# Patient Record
Sex: Female | Born: 1995 | Race: White | Hispanic: No | Marital: Single | Smoking: Former smoker
Health system: Southern US, Community
[De-identification: ages and names within clinical notes are randomized; demographics above are authoritative.]

## PROBLEM LIST (undated history)

## (undated) ENCOUNTER — Inpatient Hospital Stay: Payer: Self-pay

## (undated) DIAGNOSIS — F329 Major depressive disorder, single episode, unspecified: Secondary | ICD-10-CM

## (undated) DIAGNOSIS — S93402A Sprain of unspecified ligament of left ankle, initial encounter: Secondary | ICD-10-CM

## (undated) DIAGNOSIS — F419 Anxiety disorder, unspecified: Secondary | ICD-10-CM

## (undated) DIAGNOSIS — N920 Excessive and frequent menstruation with regular cycle: Secondary | ICD-10-CM

## (undated) DIAGNOSIS — F32A Depression, unspecified: Secondary | ICD-10-CM

## (undated) DIAGNOSIS — S8255XA Nondisplaced fracture of medial malleolus of left tibia, initial encounter for closed fracture: Secondary | ICD-10-CM

## (undated) DIAGNOSIS — I951 Orthostatic hypotension: Secondary | ICD-10-CM

## (undated) HISTORY — DX: Nondisplaced fracture of medial malleolus of left tibia, initial encounter for closed fracture: S82.55XA

## (undated) HISTORY — DX: Sprain of unspecified ligament of left ankle, initial encounter: S93.402A

## (undated) HISTORY — DX: Orthostatic hypotension: I95.1

## (undated) HISTORY — DX: Excessive and frequent menstruation with regular cycle: N92.0

---
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
## 2015-07-08 LAB — OB RESULTS CONSOLE RUBELLA ANTIBODY, IGM: Rubella: NON-IMMUNE/NOT IMMUNE

## 2015-07-08 LAB — OB RESULTS CONSOLE GC/CHLAMYDIA
CHLAMYDIA, DNA PROBE: POSITIVE
Gonorrhea: NEGATIVE

## 2015-07-08 LAB — OB RESULTS CONSOLE HEPATITIS B SURFACE ANTIGEN: Hepatitis B Surface Ag: NEGATIVE

## 2015-07-08 LAB — OB RESULTS CONSOLE VARICELLA ZOSTER ANTIBODY, IGG: Varicella: NON-IMMUNE/NOT IMMUNE

## 2015-07-08 LAB — OB RESULTS CONSOLE HIV ANTIBODY (ROUTINE TESTING): HIV: NONREACTIVE

## 2015-07-08 LAB — OB RESULTS CONSOLE RPR: RPR: NONREACTIVE

## 2015-08-10 LAB — OB RESULTS CONSOLE GC/CHLAMYDIA
Chlamydia: NEGATIVE
GC PROBE AMP, GENITAL: NEGATIVE

## 2015-11-07 NOTE — L&D Delivery Note (Signed)
Delivery Note At 10:40 AM a vigorous female infant was delivered via Vaginal, Spontaneous Delivery (Presentation: Right Occiput Anterior).  APGAR: 9, 9; weight 6 lb 7 oz (2920 g).   Placenta status: Intact, Spontaneous.  Cord: 3 vessels    Anesthesia: Local/ topical Other  Episiotomy:   Lacerations: Labial;1st degree, bilateral with transverse  laceration on right Suture Repair: 3.0 chromic Est. Blood Loss (mL): 450  Mom to postpartum.  Baby to Couplet care / Skin to Skin.  Jerene Yeager 02/02/2016, 11:39 AM

## 2016-01-11 ENCOUNTER — Encounter: Payer: Self-pay | Admitting: *Deleted

## 2016-01-11 ENCOUNTER — Observation Stay
Admission: EM | Admit: 2016-01-11 | Discharge: 2016-01-11 | Disposition: A | Discharge status: Home / Self Care | Payer: Managed Care, Other (non HMO) | Attending: Obstetrics and Gynecology | Admitting: Obstetrics and Gynecology

## 2016-01-11 DIAGNOSIS — J069 Acute upper respiratory infection, unspecified: Secondary | ICD-10-CM | POA: Insufficient documentation

## 2016-01-11 DIAGNOSIS — O99343 Other mental disorders complicating pregnancy, third trimester: Secondary | ICD-10-CM | POA: Insufficient documentation

## 2016-01-11 DIAGNOSIS — F17210 Nicotine dependence, cigarettes, uncomplicated: Secondary | ICD-10-CM | POA: Insufficient documentation

## 2016-01-11 DIAGNOSIS — O99333 Smoking (tobacco) complicating pregnancy, third trimester: Secondary | ICD-10-CM | POA: Diagnosis not present

## 2016-01-11 DIAGNOSIS — F329 Major depressive disorder, single episode, unspecified: Secondary | ICD-10-CM | POA: Insufficient documentation

## 2016-01-11 DIAGNOSIS — Z3A35 35 weeks gestation of pregnancy: Secondary | ICD-10-CM | POA: Diagnosis not present

## 2016-01-11 DIAGNOSIS — F419 Anxiety disorder, unspecified: Secondary | ICD-10-CM | POA: Insufficient documentation

## 2016-01-11 DIAGNOSIS — O26893 Other specified pregnancy related conditions, third trimester: Secondary | ICD-10-CM | POA: Diagnosis not present

## 2016-01-11 LAB — URINALYSIS COMPLETE WITH MICROSCOPIC (ARMC ONLY)
BILIRUBIN URINE: NEGATIVE
Glucose, UA: NEGATIVE mg/dL
Ketones, ur: NEGATIVE mg/dL
Leukocytes, UA: NEGATIVE
Nitrite: NEGATIVE
PH: 6 (ref 5.0–8.0)
PROTEIN: NEGATIVE mg/dL
Specific Gravity, Urine: 1.01 (ref 1.005–1.030)

## 2016-01-11 MED ORDER — ACETAMINOPHEN 500 MG PO TABS
1000.0000 mg | ORAL_TABLET | Freq: Once | ORAL | Status: AC
  Administered 2016-01-11: 1000 mg via ORAL
  Filled 2016-01-11: qty 2

## 2016-01-11 MED ORDER — OSELTAMIVIR PHOSPHATE 75 MG PO CAPS: 75.0000 mg | ORAL_CAPSULE | Freq: Two times a day (BID) | ORAL | Status: DC

## 2016-01-11 MED ORDER — ACETAMINOPHEN 500 MG PO TABS: 1000.0000 mg | ORAL_TABLET | Freq: Four times a day (QID) | ORAL | Status: DC | PRN

## 2016-01-11 MED ORDER — OSELTAMIVIR PHOSPHATE 75 MG PO CAPS
75.0000 mg | ORAL_CAPSULE | Freq: Two times a day (BID) | ORAL | Status: DC
  Administered 2016-01-11: 75 mg via ORAL
  Filled 2016-01-11: qty 1

## 2016-01-11 NOTE — Progress Notes (Signed)
In room to assume care, pt lying awake in bed, wearing mask, reports she continues to have cough and on and off nausea, lower abd in Rt and Lt groin area and constant lower back pain, rates 4/10,  Confirms active fetal mvmt, no bloody show or leaking fluid. Denies headache, fever, chills or vomiting. VSS, afebrile. Discussed plan for shift and will discharge following med admin. Pt to f/u in OB clinic on 01/21/16, offered crackers and gingerale for po challenge prior to d/c.

## 2016-01-11 NOTE — Discharge Instructions (Signed)
Influenza, Adult Influenza (flu) is an infection in the mouth, nose, and throat (respiratory tract) caused by a virus. The flu can make you feel very ill. Influenza spreads easily from person to person (contagious).  HOME CARE   Only take medicines as told by your doctor.  Use a cool mist humidifier to make breathing easier.  Get plenty of rest until your fever goes away. This usually takes 3 to 4 days.  Drink enough fluids to keep your pee (urine) clear or pale yellow.  Cover your mouth and nose when you cough or sneeze.  Wash your hands well to avoid spreading the flu.  Stay home from work or school for 1 full day, until 01/13/2016 GET HELP RIGHT AWAY IF:   You have trouble breathing or feel short of breath.  Your skin or nails turn blue.  You have severe neck pain or stiffness.  You have a severe headache, facial pain, or earache.  Your fever gets worse or keeps coming back.  You feel sick to your stomach (nauseous), throw up (vomit), or have watery poop (diarrhea).  You have chest pain.  You have a deep cough that gets worse, or you cough up more thick spit (mucus). MAKE SURE YOU:   Understand these instructions.  Will watch your condition.  Will get help right away if you are not doing well or get worse.   This information is not intended to replace advice given to you by your health care provider. Make sure you discuss any questions you have with your health care provider.   Document Released: 08/01/2008 Document Revised: 11/13/2014 Document Reviewed: 01/22/2012 Elsevier Interactive Patient Education [employer]2016 Elsevier Inc.  If you feel that you are feeling worse, come to urgent care or the emergency room.      We will discuss your surgery once again in detail at your post-op visit in two to four weeks. If you havent already done so, please call to make your appointment as soon as possible.  [facility] [place]  [address]    [place], [place] [address] [place], [place] [address]  Phone: [phone] Phone: [phone]  Fax: [phone] Fax: [phone]

## 2016-01-11 NOTE — Final Progress Note (Addendum)
Obstetrics Admission History & Physical  01/11/2016 - 6:31 PM Primary OBGYN: [facility]  Chief Complaint: fetal tachycardia in the office  History of Present Illness  20 y.o. G1 @ 11014w1d (Dating: EDC 4/10, LMP=9wk u/s), with the above CC. Pregnancy complicated by: 1ppd smoker, anxiety/depression, h/o CT with negative TOC.  Ms. Mandy FredricksonKourtney B Reeves states that she had a baby shower on Sunday and then was told yesterday that one of the kids there was +flu. She went to the office today with URI s/s (cough productive of yellowish phlegm, some chest tightness with cough) yesterday and went to the office today for evaluation. She was afebrile @ 99.9 but was noted to have fetal tachycardia on dopplers so was sent to L&D for evaluation.  Pt denies any n/v, SOB, fevers. She did not get the flu shot this year and declined the tdap. Rapid flu in the office negative.   Review of Systems: her 12 point review of systems is negative or as noted in the History of Present Illness. PMHx:  Past Medical History  Diagnosis Date  . Depression    PSHx: No past surgical history on file. Medications: none  Allergies: has No Known Allergies. OBHx: as per HPI GYNHx:  History of STIs: Yes.  .             FHx: No family history on file. Soc Hx:  Social History   Social History  . Marital Status: Single    Spouse Name: N/A  . Number of Children: N/A  . Years of Education: N/A   Occupational History  . Not on file.   Social History Main Topics  . Smoking status: Current Every Day Smoker -- 1.00 packs/day    Types: Cigarettes  . Smokeless tobacco: Not on file  . Alcohol Use: 0.0 oz/week     Comment: 3-4 mixed drinks per week  . Drug Use: No  . Sexual Activity: Yes    Birth Control/ Protection: Injection     Comment: Depo shot   Other Topics Concern  . Not on file   Social History Narrative    Objective    Current Vital Signs 24h Vital Sign Ranges  T 98 F (36.7 C) Temp  Avg: 98 F (36.7 C)  Min: 98  F (36.7 C)  Max: 98 F (36.7 C)  BP 130/77 mmHg BP  Min: 130/77  Max: 130/77  HR (!) 116 Pulse  Avg: 116  Min: 116  Max: 116  RR  16   SaO2    98/RA No Data Recorded       24  Hour I/O Current Shift I/O  Time Ins Outs       EFM: 145 baseline, +accels, no decels, mod var  Toco: +irritability  General: Well nourished, well developed female in no acute distress.  Skin:  Warm and dry.  Cardiovascular: nl s1 and s2, no MRGs. HR in the 100s Respiratory:  Clear to auscultation bilateral. Normal respiratory effort Abdomen: gravid, nttp Neuro/Psych:  Normal mood and affect.    Labs  none  Radiology none  Assessment & Plan   20 y.o. G1 @ 2714w1d with likely viral URI. Pt stable *IUP: fetal status reassuring with rNST *ID: recommended that she start bid tamiflu x 5d, given inaccuracies with rapid flu, her exposure and s/s. ER/UC precautions given. Pt told to do a lot of PO hydration as susceptible to it with illness and told about safe OTCs *Dispo: d/c to home  Mandy Reeves, Montez HagemanJr.  MD Consuella Lose Pager [phone]

## 2016-01-11 NOTE — Plan of Care (Signed)
G1 P0 EDC 02/14/2016 [facility] with complaint of elevated FHR in office up to 190's for several minutes. [facility].  Pt in Obs 1 for assessment.  Pt placed on EFM and discussed NST and initial plan of care. Pt is very anxious on arrival and teary.  Encouraging relaxation.  Will discuss with MD. Ellison Carwin Cherlynn Popiel RNC

## 2016-01-11 NOTE — Progress Notes (Signed)
Report received from Ellison Carwin. Gaither, RN. Pt to provide urine sample for UA and Tamiflu to be given. Influenza test neg. Pt may be discharged home.

## 2016-01-11 NOTE — OB Triage Note (Signed)
Discharge instructions provided to pt, verbal and written including preterm labor precautions and kick counts

## 2016-01-11 NOTE — OB Triage Note (Signed)
Pt states that she had a baby shower yesterday and found out that there was a child there that had a positive flu test.  [facility] and had a negative test herself.  She has a cough since yesterday that is occas productive with a greenish mucous that comes up and sometimes only a dry cough. Denies temperature or flu like sx.   Ellison Carwin Kalani Baray RNC

## 2016-01-11 NOTE — Progress Notes (Signed)
Pt tolerating po well, still occasional nauseated but no vomiting, explained round ligament pain and interventions to ease discomfort.Rx for Tamiflu to continue treatment given to pt. Discharge teaching and paperwork provided to pt and family present in room. Preterm labor precautions given to pt. Encouraged drinking plenty fluids, rest, warm shower, stretching, take medication as prescribed and f/u as scheduled with OB provider. Call or return to hospital sooner with any questions or concerns.

## 2016-01-11 NOTE — Progress Notes (Signed)
Pt denies any questions needs or concerns, ambulatory out of OBS with family present at side for discharge home in no apparent distress.

## 2016-01-21 LAB — OB RESULTS CONSOLE GBS: GBS: NEGATIVE

## 2016-01-28 ENCOUNTER — Observation Stay
Admission: EM | Admit: 2016-01-28 | Discharge: 2016-01-28 | Disposition: A | Discharge status: Home / Self Care | Payer: Managed Care, Other (non HMO) | Attending: Obstetrics & Gynecology | Admitting: Obstetrics & Gynecology

## 2016-01-28 DIAGNOSIS — Z3A37 37 weeks gestation of pregnancy: Secondary | ICD-10-CM | POA: Diagnosis not present

## 2016-01-28 DIAGNOSIS — R030 Elevated blood-pressure reading, without diagnosis of hypertension: Secondary | ICD-10-CM | POA: Insufficient documentation

## 2016-01-28 DIAGNOSIS — O9989 Other specified diseases and conditions complicating pregnancy, childbirth and the puerperium: Secondary | ICD-10-CM | POA: Diagnosis not present

## 2016-01-28 LAB — COMPREHENSIVE METABOLIC PANEL
ALBUMIN: 2.8 g/dL — AB (ref 3.5–5.0)
ALT: 13 U/L — ABNORMAL LOW (ref 14–54)
ANION GAP: 7 (ref 5–15)
AST: 18 U/L (ref 15–41)
Alkaline Phosphatase: 135 U/L — ABNORMAL HIGH (ref 38–126)
BILIRUBIN TOTAL: 0.6 mg/dL (ref 0.3–1.2)
BUN: 7 mg/dL (ref 6–20)
CHLORIDE: 107 mmol/L (ref 101–111)
CO2: 20 mmol/L — ABNORMAL LOW (ref 22–32)
Calcium: 8.6 mg/dL — ABNORMAL LOW (ref 8.9–10.3)
Creatinine, Ser: 0.44 mg/dL (ref 0.44–1.00)
GFR calc Af Amer: >60 mL/min (ref >60)
GLUCOSE: 105 mg/dL — AB (ref 65–99)
POTASSIUM: 3.4 mmol/L — AB (ref 3.5–5.1)
Sodium: 134 mmol/L — ABNORMAL LOW (ref 135–145)
TOTAL PROTEIN: 5.7 g/dL — AB (ref 6.5–8.1)

## 2016-01-28 LAB — CBC
HEMATOCRIT: 32.8 % — AB (ref 35.0–47.0)
Hemoglobin: 11.3 g/dL — ABNORMAL LOW (ref 12.0–16.0)
MCH: 31.1 pg (ref 26.0–34.0)
MCHC: 34.6 g/dL (ref 32.0–36.0)
MCV: 89.8 fL (ref 80.0–100.0)
PLATELETS: 205 10*3/uL (ref 150–440)
RBC: 3.65 MIL/uL — ABNORMAL LOW (ref 3.80–5.20)
RDW: 13.1 % (ref 11.5–14.5)
WBC: 8.5 10*3/uL (ref 3.6–11.0)

## 2016-01-28 LAB — PROTEIN / CREATININE RATIO, URINE
CREATININE, URINE: 23 mg/dL
Total Protein, Urine: <6 mg/dL

## 2016-01-28 NOTE — Discharge Instructions (Signed)
Pt given discharge instructions, both oral and written.  Pt verbalized understanding of plan of care. OB labor precautions discussed at length and signs of elevated blood pressure.  She and her mother are agreeable to plan. Pt will keep next scheduled OB appt with [facility].  Mandy Reeves Mandy Reeves RNC

## 2016-01-28 NOTE — Plan of Care (Signed)
CNM reviewed lab results. Pt to be discharged home with labor precautions.  Ellison Carwin Linzy Laury RNC

## 2016-01-28 NOTE — OB Triage Note (Signed)
Patient came in from office with elevated blood pressures. Patient denies complaints of pain, vaginal bleeding, or blurred vision. FHR baseline 140 with moderate variability with 15 x 15 accelerations and no decelerations. Family at bedside. Will continue to monitor.

## 2016-01-28 NOTE — Final Progress Note (Signed)
Physician Final Progress Note  Patient ID: Mandy Reeves MRN: [id number] DOB/AGE: 02/27/1996 20 y.o.  Admit date: 01/28/2016 Admitting provider: Gae Dry, MD Discharge date: 01/28/2016   Admission Diagnoses: IUP at 37 4/7 with elevated blood pressure in clinic  Discharge Diagnoses:  Active Problems:   Indication for care in labor and delivery, antepartum  IUP at 37 4/7 normo tensive, normal labs, denies HA/visual changes/epigastric pain  Consults: None  Significant Findings/ Diagnostic Studies: labs:  Results for Mandy, Reeves (MRN [id number]) as of 01/28/2016 14:53  Ref. Range 01/28/2016 12:27  Sodium Latest Ref Range: 135-145 mmol/L 134 (L)  Potassium Latest Ref Range: 3.5-5.1 mmol/L 3.4 (L)  Chloride Latest Ref Range: 101-111 mmol/L 107  CO2 Latest Ref Range: 22-32 mmol/L 20 (L)  BUN Latest Ref Range: 6-20 mg/dL 7  Creatinine Latest Ref Range: 0.44-1.00 mg/dL 0.44  Calcium Latest Ref Range: 8.9-10.3 mg/dL 8.6 (L)  EGFR (Non-African Amer.) Latest Ref Range: >60 mL/min >60  EGFR (African American) Latest Ref Range: >60 mL/min >60  Glucose Latest Ref Range: 65-99 mg/dL 105 (H)  Anion gap Latest Ref Range: 5-15  7  Alkaline Phosphatase Latest Ref Range: 38-126 U/L 135 (H)  Albumin Latest Ref Range: 3.5-5.0 g/dL 2.8 (L)  AST Latest Ref Range: 15-41 U/L 18  ALT Latest Ref Range: 14-54 U/L 13 (L)  Total Protein Latest Ref Range: 6.5-8.1 g/dL 5.7 (L)  Total Bilirubin Latest Ref Range: 0.3-1.2 mg/dL 0.6  WBC Latest Ref Range: 3.6-11.0 K/uL 8.5  RBC Latest Ref Range: 3.80-5.20 MIL/uL 3.65 (L)  Hemoglobin Latest Ref Range: 12.0-16.0 g/dL 11.3 (L)  HCT Latest Ref Range: 35.0-47.0 % 32.8 (L)  MCV Latest Ref Range: 80.0-100.0 fL 89.8  MCH Latest Ref Range: 26.0-34.0 pg 31.1  MCHC Latest Ref Range: 32.0-36.0 g/dL 34.6  RDW Latest Ref Range: 11.5-14.5 % 13.1  Platelets Latest Ref Range: 150-440 K/uL 205  Total Protein, Urine Latest Units: mg/dL <6  Protein Creatinine  Ratio Latest Ref Range: 0.00-0.15 mg/mgCre Pend  Creatinine, Urine Latest Units: mg/dL 23  Protein Creatinine Ratio unable to calculate   Procedures: NST reactive baseline 135 with moderate variability, + accelerations, - decelerations TOCO: occasional Fetal Well Being: Cat I  Discharge Condition: good  Disposition: 01-Home or Self Care  Diet: Regular diet  Discharge Activity: Activity as tolerated      Discharge Instructions    Discharge activity:  No Restrictions    Complete by:  As directed      Discharge diet:  No restrictions    Complete by:  As directed      Fetal Kick Count:  Lie on our left side for one hour after a meal, and count the number of times your baby kicks.  If it is less than 5 times, get up, move around and drink some juice.  Repeat the test 30 minutes later.  If it is still less than 5 kicks in an hour, notify your doctor.    Complete by:  As directed      LABOR:  When conractions begin, you should start to time them from the beginning of one contraction to the beginning  of the next.  When contractions are 5 - 10 minutes apart or less and have been regular for at least an hour, you should call your health care provider.    Complete by:  As directed      No sexual activity restrictions    Complete by:  As directed  Notify physician for bleeding from the vagina    Complete by:  As directed      Notify physician for blurring of vision or spots before the eyes    Complete by:  As directed      Notify physician for chills or fever    Complete by:  As directed      Notify physician for fainting spells, "black outs" or loss of consciousness    Complete by:  As directed      Notify physician for increase in vaginal discharge    Complete by:  As directed      Notify physician for leaking of fluid    Complete by:  As directed      Notify physician for pain or burning when urinating    Complete by:  As directed      Notify physician for pelvic pressure  (sudden increase)    Complete by:  As directed      Notify physician for severe or continued nausea or vomiting    Complete by:  As directed      Notify physician for sudden gushing of fluid from the vagina (with or without continued leaking)    Complete by:  As directed      Notify physician for sudden, constant, or occasional abdominal pain    Complete by:  As directed      Notify physician if baby moving less than usual    Complete by:  As directed             Medication List    TAKE these medications        acetaminophen 500 MG tablet  Commonly known as:  TYLENOL  Take 2 tablets (1,000 mg total) by mouth every 6 (six) hours as needed for mild pain, moderate pain, fever or headache.     oseltamivir 75 MG capsule  Commonly known as:  TAMIFLU  Take 1 capsule (75 mg total) by mouth 2 (two) times daily.     polyethylene glycol packet  Commonly known as:  MIRALAX / GLYCOLAX  Take 17 g by mouth daily. Reported on 01/28/2016       Follow-up Information    Please follow up.   Why:  keep regular scheduled appt with [facility]      Total time spent taking care of this patient: 30 minutes  Signed: Rod Can, CNM  This patient and plan were discussed with Dr Kenton Kingfisher 01/28/2016

## 2016-01-30 ENCOUNTER — Observation Stay
Admission: EM | Admit: 2016-01-30 | Discharge: 2016-01-30 | Disposition: A | Discharge status: Home / Self Care | Payer: Managed Care, Other (non HMO) | Attending: Obstetrics and Gynecology | Admitting: Obstetrics and Gynecology

## 2016-01-30 DIAGNOSIS — Z3A38 38 weeks gestation of pregnancy: Secondary | ICD-10-CM | POA: Diagnosis not present

## 2016-01-30 DIAGNOSIS — O9989 Other specified diseases and conditions complicating pregnancy, childbirth and the puerperium: Principal | ICD-10-CM | POA: Insufficient documentation

## 2016-01-30 DIAGNOSIS — R030 Elevated blood-pressure reading, without diagnosis of hypertension: Secondary | ICD-10-CM | POA: Diagnosis not present

## 2016-01-30 HISTORY — DX: Anxiety disorder, unspecified: F41.9

## 2016-01-30 NOTE — Final Progress Note (Signed)
Patient presented for evaluation of concern for elevated blood pressure. I reviewed her vital signs and fetal tracing, both of which were reassuring.  She has had blood pressures at home with SBP 150.  Here all very much in normal range.  Patient was discharge with preeclampsia precautions.

## 2016-02-02 ENCOUNTER — Encounter: Payer: Self-pay | Admitting: *Deleted

## 2016-02-02 ENCOUNTER — Inpatient Hospital Stay
Admission: EM | Admit: 2016-02-02 | Discharge: 2016-02-04 | DRG: 775 | Disposition: A | Discharge status: Home / Self Care | Payer: Managed Care, Other (non HMO) | Attending: Obstetrics & Gynecology | Admitting: Obstetrics & Gynecology

## 2016-02-02 DIAGNOSIS — O99334 Smoking (tobacco) complicating childbirth: Secondary | ICD-10-CM | POA: Diagnosis present

## 2016-02-02 DIAGNOSIS — F17210 Nicotine dependence, cigarettes, uncomplicated: Secondary | ICD-10-CM | POA: Diagnosis present

## 2016-02-02 DIAGNOSIS — F419 Anxiety disorder, unspecified: Secondary | ICD-10-CM | POA: Diagnosis present

## 2016-02-02 DIAGNOSIS — Z23 Encounter for immunization: Secondary | ICD-10-CM | POA: Diagnosis not present

## 2016-02-02 DIAGNOSIS — O99344 Other mental disorders complicating childbirth: Principal | ICD-10-CM | POA: Diagnosis present

## 2016-02-02 DIAGNOSIS — O99343 Other mental disorders complicating pregnancy, third trimester: Secondary | ICD-10-CM | POA: Diagnosis present

## 2016-02-02 DIAGNOSIS — Z3A38 38 weeks gestation of pregnancy: Secondary | ICD-10-CM

## 2016-02-02 DIAGNOSIS — F329 Major depressive disorder, single episode, unspecified: Secondary | ICD-10-CM | POA: Diagnosis present

## 2016-02-02 LAB — COMPREHENSIVE METABOLIC PANEL
ALT: 10 U/L — AB (ref 14–54)
AST: 18 U/L (ref 15–41)
Albumin: 3.1 g/dL — ABNORMAL LOW (ref 3.5–5.0)
Alkaline Phosphatase: 162 U/L — ABNORMAL HIGH (ref 38–126)
Anion gap: 9 (ref 5–15)
BILIRUBIN TOTAL: 0.4 mg/dL (ref 0.3–1.2)
BUN: 8 mg/dL (ref 6–20)
CO2: 20 mmol/L — ABNORMAL LOW (ref 22–32)
CREATININE: 0.45 mg/dL (ref 0.44–1.00)
Calcium: 9 mg/dL (ref 8.9–10.3)
Chloride: 104 mmol/L (ref 101–111)
GFR calc Af Amer: >60 mL/min (ref >60)
Glucose, Bld: 101 mg/dL — ABNORMAL HIGH (ref 65–99)
Potassium: 3.6 mmol/L (ref 3.5–5.1)
Sodium: 133 mmol/L — ABNORMAL LOW (ref 135–145)
TOTAL PROTEIN: 6.4 g/dL — AB (ref 6.5–8.1)

## 2016-02-02 LAB — CHLAMYDIA/NGC RT PCR (ARMC ONLY)
Chlamydia Tr: NOT DETECTED
N GONORRHOEAE: NOT DETECTED

## 2016-02-02 LAB — RAPID HIV SCREEN (HIV 1/2 AB+AG)
HIV 1/2 Antibodies: NONREACTIVE
HIV-1 P24 Antigen - HIV24: NONREACTIVE

## 2016-02-02 LAB — CBC
HEMATOCRIT: 34.3 % — AB (ref 35.0–47.0)
HEMOGLOBIN: 12.1 g/dL (ref 12.0–16.0)
MCH: 32.1 pg (ref 26.0–34.0)
MCHC: 35.4 g/dL (ref 32.0–36.0)
MCV: 90.7 fL (ref 80.0–100.0)
Platelets: 220 10*3/uL (ref 150–440)
RBC: 3.78 MIL/uL — ABNORMAL LOW (ref 3.80–5.20)
RDW: 13.3 % (ref 11.5–14.5)
WBC: 10.7 10*3/uL (ref 3.6–11.0)

## 2016-02-02 LAB — TYPE AND SCREEN
ABO/RH(D): O POS
ANTIBODY SCREEN: NEGATIVE

## 2016-02-02 LAB — ABO/RH: ABO/RH(D): O POS

## 2016-02-02 MED ORDER — LACTATED RINGERS IV SOLN
INTRAVENOUS | Status: DC
  Administered 2016-02-02: 07:00:00 via INTRAVENOUS

## 2016-02-02 MED ORDER — ONDANSETRON HCL 4 MG/2ML IJ SOLN
4.0000 mg | INTRAMUSCULAR | Status: AC
  Administered 2016-02-02: 4 mg via INTRAVENOUS
  Filled 2016-02-02: qty 2

## 2016-02-02 MED ORDER — PRENATAL MULTIVITAMIN CH
1.0000 | ORAL_TABLET | Freq: Every day | ORAL | Status: DC
  Administered 2016-02-03 – 2016-02-04 (×2): 1 via ORAL
  Filled 2016-02-02 (×2): qty 1

## 2016-02-02 MED ORDER — OXYTOCIN BOLUS FROM INFUSION: 500.0000 mL | INTRAVENOUS | Status: DC

## 2016-02-02 MED ORDER — MISOPROSTOL 200 MCG PO TABS
800.0000 ug | ORAL_TABLET | Freq: Once | ORAL | Status: AC
  Administered 2016-02-02: 800 ug via RECTAL

## 2016-02-02 MED ORDER — BENZOCAINE-MENTHOL 20-0.5 % EX AERO
INHALATION_SPRAY | CUTANEOUS | Status: AC
  Filled 2016-02-02: qty 56

## 2016-02-02 MED ORDER — ONDANSETRON HCL 4 MG/2ML IJ SOLN
4.0000 mg | Freq: Four times a day (QID) | INTRAMUSCULAR | Status: DC | PRN
  Administered 2016-02-02: 4 mg via INTRAVENOUS
  Filled 2016-02-02: qty 2

## 2016-02-02 MED ORDER — SENNOSIDES-DOCUSATE SODIUM 8.6-50 MG PO TABS
2.0000 | ORAL_TABLET | ORAL | Status: DC
  Administered 2016-02-02 – 2016-02-04 (×2): 2 via ORAL
  Filled 2016-02-02 (×2): qty 2

## 2016-02-02 MED ORDER — FERROUS SULFATE 325 (65 FE) MG PO TABS
325.0000 mg | ORAL_TABLET | Freq: Every day | ORAL | Status: DC
  Administered 2016-02-03 – 2016-02-04 (×2): 325 mg via ORAL
  Filled 2016-02-02 (×2): qty 1

## 2016-02-02 MED ORDER — SIMETHICONE 80 MG PO CHEW: 80.0000 mg | CHEWABLE_TABLET | ORAL | Status: DC | PRN

## 2016-02-02 MED ORDER — ONDANSETRON HCL 4 MG/2ML IJ SOLN: 4.0000 mg | INTRAMUSCULAR | Status: DC | PRN

## 2016-02-02 MED ORDER — IBUPROFEN 600 MG PO TABS
600.0000 mg | ORAL_TABLET | Freq: Four times a day (QID) | ORAL | Status: DC | PRN
  Administered 2016-02-02 – 2016-02-04 (×7): 600 mg via ORAL
  Filled 2016-02-02 (×7): qty 1

## 2016-02-02 MED ORDER — LANOLIN HYDROUS EX OINT
TOPICAL_OINTMENT | CUTANEOUS | Status: DC | PRN
Start: 2016-02-02 — End: 2016-02-04

## 2016-02-02 MED ORDER — BUTORPHANOL TARTRATE 1 MG/ML IJ SOLN: 1.0000 mg | INTRAMUSCULAR | Status: DC | PRN

## 2016-02-02 MED ORDER — FAMOTIDINE IN NACL 20-0.9 MG/50ML-% IV SOLN
20.0000 mg | Freq: Two times a day (BID) | INTRAVENOUS | Status: DC
  Administered 2016-02-02: 20 mg via INTRAVENOUS
  Filled 2016-02-02: qty 50

## 2016-02-02 MED ORDER — FAMOTIDINE 20 MG PO TABS: 20.0000 mg | ORAL_TABLET | Freq: Two times a day (BID) | ORAL | Status: DC

## 2016-02-02 MED ORDER — WITCH HAZEL-GLYCERIN EX PADS: 1.0000 "application " | MEDICATED_PAD | CUTANEOUS | Status: DC | PRN

## 2016-02-02 MED ORDER — BENZOCAINE-MENTHOL 20-0.5 % EX AERO: 1.0000 "application " | INHALATION_SPRAY | CUTANEOUS | Status: DC | PRN

## 2016-02-02 MED ORDER — BUTORPHANOL TARTRATE 1 MG/ML IJ SOLN
1.0000 mg | INTRAMUSCULAR | Status: DC | PRN
  Administered 2016-02-02: 1 mg via INTRAVENOUS
  Filled 2016-02-02: qty 1

## 2016-02-02 MED ORDER — ONDANSETRON HCL 4 MG PO TABS: 4.0000 mg | ORAL_TABLET | ORAL | Status: DC | PRN

## 2016-02-02 MED ORDER — DOCUSATE SODIUM 100 MG PO CAPS
100.0000 mg | ORAL_CAPSULE | Freq: Every day | ORAL | Status: DC
  Administered 2016-02-03 – 2016-02-04 (×2): 100 mg via ORAL
  Filled 2016-02-02 (×2): qty 1

## 2016-02-02 MED ORDER — ACETAMINOPHEN 325 MG PO TABS: 650.0000 mg | ORAL_TABLET | ORAL | Status: DC | PRN

## 2016-02-02 MED ORDER — OXYTOCIN 40 UNITS IN LACTATED RINGERS INFUSION - SIMPLE MED
2.5000 [IU]/h | INTRAVENOUS | Status: DC
  Administered 2016-02-02: 39.96 [IU]/h via INTRAVENOUS
  Filled 2016-02-02: qty 1000

## 2016-02-02 MED ORDER — DIBUCAINE 1 % RE OINT: 1.0000 "application " | TOPICAL_OINTMENT | RECTAL | Status: DC | PRN

## 2016-02-02 MED ORDER — TETANUS-DIPHTH-ACELL PERTUSSIS 5-2.5-18.5 LF-MCG/0.5 IM SUSP: 0.5000 mL | Freq: Once | INTRAMUSCULAR | Status: DC

## 2016-02-02 MED ORDER — LACTATED RINGERS IV SOLN: 500.0000 mL | INTRAVENOUS | Status: DC | PRN

## 2016-02-02 NOTE — H&P (Signed)
OB History & Physical   History of Present Illness:  Chief Complaint: Complains of contractions since 2330 last night. Began having nausea and vomiting after arrival this AM. Complains of leaking fluid this AM. HPI:  Mandy Reeves is a 20 y.o. G1P0000 female at 6247w2d dated by LMP=9wk ultrasound.  Her pregnancy has been complicated by Chlamydia with a negative TOC, smoking, anxiety/depression, and multiple abdominal and facial piercings. .  She presented to L&D for evaluation of labor. Her contractions on arrival were q3-7 min apart, then spaced a little after Zofran was given, but have now increased to q3 min apart. Started feeling some leakage of water and was found to have had gross SROM of clear fluid with positive Nitrazine. Cervix has progressed from 3cm to 4 cm.  Pren[facility] care site: [facility]. Has had a counselor for her depression/anxiety in the past. Not currently on any medication. Smoking 1/2 PPD. Was exposed to Tamiflu earlier this month and was prescribed Tamiflu. Declined TDAP antenatally.       Maternal Medical History:   Past Medical History  Diagnosis Date  . Depression   . Anxiety     Past Surgical History  Procedure Laterality Date  . Tonsillectomy and adenoidectomy      at age 89    No Known Allergies  Prior to Admission medications   Medication Sig Start Date End Date Taking? Authorizing Provider  Prenatal Vit-Fe Fumarate-FA (PRENATAL MULTIVITAMIN) TABS tablet Take 1 tablet by mouth daily at 12 noon.   Yes Historical Provider, MD  acetaminophen (TYLENOL) 500 MG tablet Take 2 tablets (1,000 mg total) by mouth every 6 (six) hours as needed for mild pain, moderate pain, fever or headache. Patient not taking: Reported on 01/28/2016 01/11/16   [place] Bingharlie Pickens, MD  oseltamivir (TAMIFLU) 75 MG capsule Take 1 capsule (75 mg total) by mouth 2 (two) times daily. Patient not taking: Reported on 01/28/2016 01/11/16   [place] Bingharlie Pickens, MD  polyethylene glycol  (MIRALAX / GLYCOLAX) packet Take 17 g by mouth daily. Reported on 02/02/2016 01/31/13   Jolene SchimkeKim B Winson, NP          Social History: She  reports that she has been smoking Cigarettes.  She has been smoking about 0.50 packs per day. She has never used smokeless tobacco. She reports that she does not drink alcohol or use illicit drugs.  Family History: family history includes Cancer in her maternal grandfather and paternal grandmother; Hypertension in her father; Stroke in her father.   Review of Systems: Negative x 10 systems reviewed except as noted in the HPI.      Physical Exam:  Vital Signs: BP 137/73 mmHg  Pulse 77  Temp(Src) 98.2 F (36.8 C) (Oral)  Resp 20  Ht 5\' 3"  (1.6 m)  Wt 80.74 kg (178 lb)  BMI 31.54 kg/m2  LMP 05/10/2015 General: moaning with contractions, vomiting HEENT: normocephalic, atraumatic, piercings in nose, lip Heart: regular rate & rhythm.  No murmurs Lungs: clear to auscultation bilaterally Abdomen: soft, gravid, tender with contractions;  EFW: 6# 2 dermal piercings on the right lower abdomen. States went to dermatology to have removed, but dermatologist would not remove. Two umbilical piercings which were removed Pelvic:   External: Normal external female genitalia  Cervix: Dilation: 4 / Effacement (%): 100 / Station: 0, -1     ROM: + pooling;+nitrazine;  Extremities: non-tender, symmetric, trace edema bilaterally.  DTRs: +3/+3  Neurologic: Alert & oriented x 3.    Pertinent  Results:  Prenatal Labs: Blood type/Rh O positive  Antibody screen negative  Rubella Varicella Nonimmune nonimmune  RPR negative  HBsAg negative  HIV negative  GC negative  Chlamydia Positive with NOB, TOC negative x2  Genetic screening declines  1 hour GTT 77  3 hour GTT NA  GBS negative on 01/21/2016   Baseline FHR: 130s with accelerations to 160s, moderate variability, some variable decelerations to 100-110s. Toco: contractions q3 min lasting  >60sec   Assessment:  Mandy Reeves is a 20 y.o. G1P0000 female at [redacted]w[redacted]d in labor with SROM (around 1610-9604)   Plan:  1. Admit to Labor & Delivery-expectant, supportive management 2. CBC, T&S already drawn, add CMP. IVF. Zofran for nausea vomiting and Pepcid for reflux 3. GBS negative.   4. Consents obtained. 5. FWB- overall reassuring, monitor for worsening decelerations 6. Stadol for pain, epidural if desires  Farrel Conners  02/02/2016 8:46 AM

## 2016-02-02 NOTE — Discharge Summary (Signed)
Physician Obstetric Discharge Summary  Patient ID: Mandy FredricksonKourtney B Wamboldt MRN: [id number] DOB/AGE: January 04, 1996 20 y.o.   Date of Admission: 02/02/2016  Date of Discharge: 02/04/2016  Admitting Diagnosis: Onset of Labor at 5145w2d  Secondary Diagnosis: depression  Mode of Delivery: normal spontaneous vaginal delivery 02/02/2016       Discharge Diagnosis: Term intrauterine pregnancy delivered at 38 weeks   Intrapartum Procedures: repair of labial lacerations   Post partum procedures: none  Complications: labial laceration   Brief Hospital Course  Mandy FredricksonKourtney B Kubitz is a G1P1001 who had a SVD on 02/02/2016;  for further details of this delivery, please refer to the delivery note.  Patient had an uncomplicated postpartum course.  By time of discharge on PPD#2, her pain was controlled on oral pain medications; she had appropriate lochia and was ambulating, voiding without difficulty and tolerating regular diet.  She was deemed stable for discharge to home.    Labs: CBC Latest Ref Rng 02/03/2016 02/02/2016 01/28/2016  WBC 3.6 - 11.0 K/uL 12.2(H) 10.7 8.5  Hemoglobin 12.0 - 16.0 g/dL 10.2(L) 12.1 11.3(L)  Hematocrit 35.0 - 47.0 % 30.0(L) 34.3(L) 32.8(L)  Platelets 150 - 440 K/uL 186 220 205   O POS  Physical exam:  Blood pressure 108/62, pulse 72, temperature 97.7 F (36.5 C), temperature source Oral, resp. rate 18, height 5\' 3"  (1.6 m), weight 80.74 kg (178 lb), last menstrual period 05/10/2015, SpO2 99 %, breastfeeding. General: alert and no distress Lochia: appropriate Abdomen: soft, NT Uterine Fundus: firm Incision: NA Extremities: No evidence of DVT seen on physical exam. No lower extremity edema.  Discharge Instructions: Per After Visit Summary. Activity: Advance as tolerated. Pelvic rest for 6 weeks.  Also refer to Discharge Instructions Diet: Regular Medications:   Medication List    STOP taking these medications        acetaminophen 500 MG tablet  Commonly known as:  TYLENOL      oseltamivir 75 MG capsule  Commonly known as:  TAMIFLU     polyethylene glycol packet  Commonly known as:  MIRALAX / GLYCOLAX      TAKE these medications        prenatal multivitamin Tabs tablet  Take 1 tablet by mouth daily at 12 noon.       Outpatient follow up:  Follow-up Information    Follow up with GUTIERREZ, COLLEEN, CNM. Schedule an appointment as soon as possible for a visit in 2 weeks.   Specialty:  Certified Nurse Midwife   Why:  postpartum mood check   Contact information:   [address] [place] [id number] [phone]      Postpartum contraception: uncertain, to decide at 2 week postpartum visit  Discharged Condition: good  Discharged to: home   Newborn Data: Disposition:home with mother  Apgars: APGAR (1 MIN): 9   APGAR (5 MINS): 9   APGAR (10 MINS):  NA Weight: 2920  Baby Feeding: Breast  Randalyn Ahmed, CNM  This patient and plan were discussed with Dr Elesa MassedWard 02/04/2016

## 2016-02-03 LAB — CBC
HCT: 30 % — ABNORMAL LOW (ref 35.0–47.0)
Hemoglobin: 10.2 g/dL — ABNORMAL LOW (ref 12.0–16.0)
MCH: 31.3 pg (ref 26.0–34.0)
MCHC: 34.2 g/dL (ref 32.0–36.0)
MCV: 91.6 fL (ref 80.0–100.0)
PLATELETS: 186 10*3/uL (ref 150–440)
RBC: 3.27 MIL/uL — ABNORMAL LOW (ref 3.80–5.20)
RDW: 13.3 % (ref 11.5–14.5)
WBC: 12.2 10*3/uL — ABNORMAL HIGH (ref 3.6–11.0)

## 2016-02-03 LAB — RPR: RPR Ser Ql: NONREACTIVE

## 2016-02-03 MED ORDER — INFLUENZA VAC SPLIT QUAD 0.5 ML IM SUSY
0.5000 mL | PREFILLED_SYRINGE | INTRAMUSCULAR | Status: AC
  Administered 2016-02-04: 0.5 mL via INTRAMUSCULAR
  Filled 2016-02-03: qty 0.5

## 2016-02-03 NOTE — Progress Notes (Signed)
Post Partum Day 1 Subjective: Doing well, having some feelings of pressure in her bottom.  Tolerating pain with PO meds, tolerating PO intake, and voiding and ambulating without difficulty. Baby in room with mom  No CP SOB F/C N/V or leg pain No HA, change of vision, RUQ/epigastric pain  Objective: BP 111/72 mmHg  Pulse 79  Temp(Src) 97.7 F (36.5 C) (Oral)  Resp 18  Ht 5\' 3"  (1.6 m)  Wt 80.74 kg (178 lb)  BMI 31.54 kg/m2  LMP 05/10/2015  Breastfeeding  Physical Exam:  General: NAD CV: RRR Pulm: nl effort, CTABL Lochia: moderate Uterine Fundus: fundus firm and below umbilicus DVT Evaluation: no cords, ttp LEs    Recent Labs  02/02/16 0609 02/03/16 0556  HGB 12.1 10.2*  HCT 34.3* 30.0*  WBC 10.7 12.2*  PLT 220 186    Assessment/Plan: 19 y.o. G1P1001 stable postpartum day # 1  1. Continue routine postpartum care 2. Breastfeeding 3. Considering contraceptive options 4. O+/RNI/VNI 5. TDAP declined antenatally 6. Discharge to home tomorrow    Mandy Reeves,Kamera Dubas, CNM   This patient and plan were discussed with Dr Bonney AidStaebler 02/03/2016

## 2016-02-04 MED ORDER — MEASLES, MUMPS & RUBELLA VAC ~~LOC~~ INJ
0.5000 mL | INJECTION | Freq: Once | SUBCUTANEOUS | Status: AC
  Administered 2016-02-04: 0.5 mL via SUBCUTANEOUS
  Filled 2016-02-04: qty 0.5

## 2016-02-04 MED ORDER — VARICELLA VIRUS VACCINE LIVE 1350 PFU/0.5ML IJ SUSR
0.5000 mL | Freq: Once | INTRAMUSCULAR | Status: AC
  Administered 2016-02-04: 0.5 mL via SUBCUTANEOUS
  Filled 2016-02-04: qty 0.5

## 2016-02-04 NOTE — Progress Notes (Signed)
D/C order from MD.  Reviewed d/c instructions and prescriptions with patient and answered any questions.  Patient d/c home with infant via wheelchair by nursing/auxillary. 

## 2016-02-04 NOTE — Discharge Instructions (Signed)
Please call your doctor or return to the ER if you experience any chest pains, shortness of breath, fever greater than 101, any heavy bleeding or large clots, and foul smelling vaginal discharge, any worsening abdominal pain & cramping that is not controlled by pain medication, or any signs of post partum depression.  No tampons, enemas, douches, or sexual intercourse for 6 weeks.  Also avoid tub baths, hot tubs, or swimming for 6 weeks. ° ° ° °Postpartum Care After Vaginal Delivery °After you deliver your newborn (postpartum period), the usual stay in the hospital is 24-72 hours. If there were problems with your labor or delivery, or if you have other medical problems, you might be in the hospital longer.  °While you are in the hospital, you will receive help and instructions on how to care for yourself and your newborn during the postpartum period.  °While you are in the hospital: °· Be sure to tell your nurses if you have pain or discomfort, as well as where you feel the pain and what makes the pain worse. °· If you had an incision made near your vagina (episiotomy) or if you had some tearing during delivery, the nurses may put ice packs on your episiotomy or tear. The ice packs may help to reduce the pain and swelling. °· If you are breastfeeding, you may feel uncomfortable contractions of your uterus for a couple of weeks. This is normal. The contractions help your uterus get back to normal size. °· It is normal to have some bleeding after delivery. °¨ For the first 1-3 days after delivery, the flow is red and the amount may be similar to a period. °¨ It is common for the flow to start and stop. °¨ In the first few days, you may pass some small clots. Let your nurses know if you begin to pass large clots or your flow increases. °¨ Do not  flush blood clots down the toilet before having the nurse look at them. °¨ During the next 3-10 days after delivery, your flow should become more watery and pink or  brown-tinged in color. °¨ Ten to fourteen days after delivery, your flow should be a small amount of yellowish-white discharge. °¨ The amount of your flow will decrease over the first few weeks after delivery. Your flow may stop in 6-8 weeks. Most women have had their flow stop by 12 weeks after delivery. °· You should change your sanitary pads frequently. °· Wash your hands thoroughly with soap and water for at least 20 seconds after changing pads, using the toilet, or before holding or feeding your newborn. °· You should feel like you need to empty your bladder within the first 6-8 hours after delivery. °· In case you become weak, lightheaded, or faint, call your nurse before you get out of bed for the first time and before you take a shower for the first time. °· Within the first few days after delivery, your breasts may begin to feel tender and full. This is called engorgement. Breast tenderness usually goes away within 48-72 hours after engorgement occurs. You may also notice milk leaking from your breasts. If you are not breastfeeding, do not stimulate your breasts. Breast stimulation can make your breasts produce more milk. °· Spending as much time as possible with your newborn is very important. During this time, you and your newborn can feel close and get to know each other. Having your newborn stay in your room (rooming in) will help to strengthen   the bond with your newborn.  It will give you time to get to know your newborn and become comfortable caring for your newborn. °· Your hormones change after delivery. Sometimes the hormone changes can temporarily cause you to feel sad or tearful. These feelings should not last more than a few days. If these feelings last longer than that, you should talk to your caregiver. °· If desired, talk to your caregiver about methods of family planning or contraception. °· Talk to your caregiver about immunizations. Your caregiver may want you to have the following  immunizations before leaving the hospital: °¨ Tetanus, diphtheria, and pertussis (Tdap) or tetanus and diphtheria (Td) immunization. It is very important that you and your family (including grandparents) or others caring for your newborn are up-to-date with the Tdap or Td immunizations. The Tdap or Td immunization can help protect your newborn from getting ill. °¨ Rubella immunization. °¨ Varicella (chickenpox) immunization. °¨ Influenza immunization. You should receive this annual immunization if you did not receive the immunization during your pregnancy. °  °This information is not intended to replace advice given to you by your health care provider. Make sure you discuss any questions you have with your health care provider. °  °Document Released: 08/20/2007 Document Revised: 07/17/2012 Document Reviewed: 06/19/2012 °Elsevier Interactive Patient Education ©2016 Elsevier Inc. ° °

## 2017-04-08 DIAGNOSIS — M79606 Pain in leg, unspecified: Secondary | ICD-10-CM | POA: Diagnosis not present

## 2017-04-08 DIAGNOSIS — M25469 Effusion, unspecified knee: Secondary | ICD-10-CM | POA: Diagnosis not present

## 2017-04-08 DIAGNOSIS — M25462 Effusion, left knee: Secondary | ICD-10-CM | POA: Diagnosis not present

## 2017-04-08 DIAGNOSIS — M79604 Pain in right leg: Secondary | ICD-10-CM | POA: Diagnosis not present

## 2017-04-08 DIAGNOSIS — S0990XA Unspecified injury of head, initial encounter: Secondary | ICD-10-CM | POA: Diagnosis not present

## 2017-04-08 DIAGNOSIS — Z041 Encounter for examination and observation following transport accident: Secondary | ICD-10-CM | POA: Diagnosis not present

## 2017-04-08 DIAGNOSIS — Z87891 Personal history of nicotine dependence: Secondary | ICD-10-CM | POA: Diagnosis not present

## 2017-04-08 DIAGNOSIS — Z975 Presence of (intrauterine) contraceptive device: Secondary | ICD-10-CM | POA: Diagnosis not present

## 2017-04-08 DIAGNOSIS — S299XXA Unspecified injury of thorax, initial encounter: Secondary | ICD-10-CM | POA: Diagnosis not present

## 2017-04-08 DIAGNOSIS — M25562 Pain in left knee: Secondary | ICD-10-CM | POA: Diagnosis not present

## 2017-04-08 DIAGNOSIS — S199XXA Unspecified injury of neck, initial encounter: Secondary | ICD-10-CM | POA: Diagnosis not present

## 2017-04-08 DIAGNOSIS — S8262XA Displaced fracture of lateral malleolus of left fibula, initial encounter for closed fracture: Secondary | ICD-10-CM | POA: Diagnosis not present

## 2017-04-08 DIAGNOSIS — S8992XA Unspecified injury of left lower leg, initial encounter: Secondary | ICD-10-CM | POA: Diagnosis not present

## 2017-04-08 DIAGNOSIS — R7989 Other specified abnormal findings of blood chemistry: Secondary | ICD-10-CM | POA: Diagnosis not present

## 2017-04-08 DIAGNOSIS — R69 Illness, unspecified: Secondary | ICD-10-CM | POA: Diagnosis not present

## 2017-04-08 DIAGNOSIS — F10129 Alcohol abuse with intoxication, unspecified: Secondary | ICD-10-CM | POA: Diagnosis not present

## 2017-04-08 DIAGNOSIS — S99912A Unspecified injury of left ankle, initial encounter: Secondary | ICD-10-CM | POA: Diagnosis not present

## 2017-04-08 DIAGNOSIS — S3992XA Unspecified injury of lower back, initial encounter: Secondary | ICD-10-CM | POA: Diagnosis not present

## 2017-04-08 DIAGNOSIS — S3993XA Unspecified injury of pelvis, initial encounter: Secondary | ICD-10-CM | POA: Diagnosis not present

## 2017-04-08 DIAGNOSIS — S3991XA Unspecified injury of abdomen, initial encounter: Secondary | ICD-10-CM | POA: Diagnosis not present

## 2017-04-08 DIAGNOSIS — M79605 Pain in left leg: Secondary | ICD-10-CM | POA: Diagnosis not present

## 2017-04-08 DIAGNOSIS — S79922A Unspecified injury of left thigh, initial encounter: Secondary | ICD-10-CM | POA: Diagnosis not present

## 2017-04-08 DIAGNOSIS — W2211XA Striking against or struck by driver side automobile airbag, initial encounter: Secondary | ICD-10-CM | POA: Diagnosis not present

## 2017-04-20 DIAGNOSIS — S8255XA Nondisplaced fracture of medial malleolus of left tibia, initial encounter for closed fracture: Secondary | ICD-10-CM

## 2017-04-20 HISTORY — DX: Nondisplaced fracture of medial malleolus of left tibia, initial encounter for closed fracture: S82.55XA

## 2017-04-23 DIAGNOSIS — S93402A Sprain of unspecified ligament of left ankle, initial encounter: Secondary | ICD-10-CM | POA: Insufficient documentation

## 2017-04-23 HISTORY — DX: Sprain of unspecified ligament of left ankle, initial encounter: S93.402A

## 2017-11-19 ENCOUNTER — Encounter: Payer: Self-pay | Admitting: Maternal Newborn

## 2017-11-19 ENCOUNTER — Ambulatory Visit (INDEPENDENT_AMBULATORY_CARE_PROVIDER_SITE_OTHER): Payer: BLUE CROSS/BLUE SHIELD | Admitting: Maternal Newborn

## 2017-11-19 VITALS — BP 116/80 | HR 72 | Ht 63.0 in | Wt 146.0 lb

## 2017-11-19 DIAGNOSIS — Z01419 Encounter for gynecological examination (general) (routine) without abnormal findings: Secondary | ICD-10-CM | POA: Diagnosis not present

## 2017-11-19 DIAGNOSIS — Z124 Encounter for screening for malignant neoplasm of cervix: Secondary | ICD-10-CM

## 2017-11-19 DIAGNOSIS — R102 Pelvic and perineal pain: Secondary | ICD-10-CM

## 2017-11-19 DIAGNOSIS — Z113 Encounter for screening for infections with a predominantly sexual mode of transmission: Secondary | ICD-10-CM | POA: Diagnosis not present

## 2017-11-19 NOTE — Progress Notes (Signed)
Gynecology Annual Exam  PCP: Patient, No Pcp Per  Chief Complaint:  Chief Complaint  Patient presents with  . Gynecologic Exam    problem c mirena    History of Present Illness: Patient is a 22 y.o. G1P1001 presenting for an annual exam. The patient has had pelvic pain on her right side for about six months. It is intermittent, of moderate severity, and not associated with her menstrual cycle. Nothing makes it better or worse.  LMP: Patient's last menstrual period was 11/12/2017 (approximate). Menarche:11 Average Interval: regular, 30 days Duration of flow: a few days Heavy Menses: no Clots: no Intermenstrual Bleeding: no Postcoital Bleeding: no Dysmenorrhea: yes  The patient is sexually active. She currently uses Mirena IUD for contraception. She denies dyspareunia.  The patient does not perform self breast exams.  There is notable family history of breast cancer in her family, but she is uncertain which relative had breast cancer. No history of ovarian cancer.  The patient wears seatbelts: yes.  The patient has regular exercise: no.    The patient reports current symptoms of anxiety and depression, but feels that she has the resources she needs to cope and does not desire medication at this time.    Review of Systems  Constitutional: Negative for malaise/fatigue and weight loss.  HENT: Negative.   Eyes: Negative.   Respiratory: Negative for cough, shortness of breath and wheezing.   Cardiovascular: Negative for chest pain and palpitations.  Gastrointestinal: Negative for constipation, diarrhea, heartburn and nausea.  Genitourinary: Negative for dysuria and urgency.       Has had right sided pelvic pain for about six months  Musculoskeletal: Negative.   Skin: Negative.   Neurological: Negative.   Endo/Heme/Allergies: Negative.   Psychiatric/Behavioral: Positive for depression. The patient is nervous/anxious.   All other systems reviewed and are negative.   Past  Medical History:  Past Medical History:  Diagnosis Date  . Anxiety   . Depression   . Menorrhagia   . Orthostatic hypotension     Past Surgical History:  Past Surgical History:  Procedure Laterality Date  . TONSILLECTOMY AND ADENOIDECTOMY  05/2013   at age 22    Gynecologic History:  Patient's last menstrual period was 11/12/2017 (approximate). Contraception: IUD Last Pap: Has never had a Pap due to age.  Obstetric History: G1P1001  Family History:  Family History  Problem Relation Age of Onset  . Hypertension Father   . Stroke Father   . Cancer Maternal Grandfather [id number]       kidney  . Hypertension Maternal Grandfather   . Cancer Paternal Grandmother        ovarian  . Hypertension Mother   . Diabetes Paternal Grandfather   . Cancer Other [id number]       BREAST    Social History:  Social History   Socioeconomic History  . Marital status: Single    Spouse name: Not on file  . Number of children: 1  . Years of education: Not on file  . Highest education level: Not on file  Social Needs  . Financial resource strain: Not on file  . Food insecurity - worry: Not on file  . Food insecurity - inability: Not on file  . Transportation needs - medical: Not on file  . Transportation needs - non-medical: Not on file  Occupational History  . Occupation: RETAIL Investment banker, corporateALES    Employer: [employer]  Tobacco Use  . Smoking status: Former Smoker    Packs/day:  0.50    Types: Cigarettes  . Smokeless tobacco: Never Used  Substance and Sexual Activity  . Alcohol use: No    Alcohol/week: 0.0 oz    Frequency: Never    Comment: 3-4 mixed drinks per week prior to pregnancy  . Drug use: No  . Sexual activity: Yes    Birth control/protection: IUD      Other Topics Concern  . Not on file  Social History Narrative  . Not on file    Allergies:  No Known Allergies  Medications: Prior to Admission medications   Medication Sig Start Date End Date Taking? Authorizing Provider  Prenatal  Vit-Fe Fumarate-FA (PRENATAL MULTIVITAMIN) TABS tablet Take 1 tablet by mouth daily at 12 noon.    [provider]    Physical Exam Vitals: Blood pressure 116/80, pulse 72, height 5\' 3"  (1.6 m), weight 146 lb (66.2 kg), last menstrual period 11/12/2017.  General: NAD HEENT: normocephalic, anicteric Thyroid: no enlargement, no palpable nodules Pulmonary: No increased work of breathing, CTAB Cardiovascular: RRR, distal pulses 2+ Breast: Breasts symmetrical, no tenderness, no palpable nodules or masses, no skin or nipple retraction present, no nipple discharge. Bilateral nipple peircings with no signs of infection or swelling.  No axillary or supraclavicular lymphadenopathy. Abdomen: soft, non-tender, non-distended.  Umbilicus without lesions.  No hepatomegaly, splenomegaly or masses palpable. No evidence of hernia  Genitourinary:  External: Normal external female genitalia.  Normal urethral  meatus, normal Bartholin's and Skene's glands.    Vagina: Normal vaginal mucosa, no evidence of prolapse.    Cervix: Grossly normal in appearance, no bleeding  Uterus: Non-enlarged, mobile, normal contour.  No CMT. Slight  tenderness to palpation in fundus on bimanual exam.  Adnexa: ovaries non-enlarged, no adnexal masses  Rectal: deferred  Lymphatic: no evidence of inguinal lymphadenopathy Extremities: no edema, erythema, or tenderness Neurologic: Grossly intact Psychiatric: mood appropriate, affect full  Assessment: 22 y.o. G1P1001 routine annual exam with intermittent  right-sided pelvic pain for the past six months.  Plan: Problem List Items Addressed This Visit    None    Visit Diagnoses    Encounter for annual routine gynecological examination    -  Primary   Pap smear for cervical cancer screening       Relevant Orders   IGP,CtNgTv,rfx Aptima HPV ASCU   Pelvic pain       Relevant Orders   US PELVIS TRANSVANGINAL NON-OB (TV ONLY)   Screen for STD (sexually transmitted disease)        Relevant Orders   IGP,CtNgTv,rfx Aptima HPV ASCU      1) Gardasil  - Patient has not previously completed 3 shot series   2) STI screening was offered and accepted ([facility]).  3) ASCCP guidelines and rationale discussed.  Patient opts for every 3 year screening interval.  4) Contraception - Patient is satisfied with Mirena but concerned that it could be causing her pain. We discussed safe sex practices to reduce her furture risk of STIs.   5) Pelvic ultrasound ordered due to six month history of pelvic pain on the right side.  Return in about 1 week (around 11/26/2017) for Pelvic ultrasound/follow-up pelvic pain.  Marcelyn Bruins, CNM 11/19/2017  11:39 AM

## 2017-11-21 LAB — IGP,CTNGTV,RFX APTIMA HPV ASCU
Chlamydia, Nuc. Acid Amp: NEGATIVE
Gonococcus, Nuc. Acid Amp: NEGATIVE
PAP Smear Comment: 0
TRICH VAG BY NAA: NEGATIVE

## 2017-11-28 ENCOUNTER — Encounter: Payer: Self-pay | Admitting: Maternal Newborn

## 2017-11-28 ENCOUNTER — Ambulatory Visit (INDEPENDENT_AMBULATORY_CARE_PROVIDER_SITE_OTHER): Payer: BLUE CROSS/BLUE SHIELD | Admitting: Maternal Newborn

## 2017-11-28 ENCOUNTER — Ambulatory Visit (INDEPENDENT_AMBULATORY_CARE_PROVIDER_SITE_OTHER): Payer: BLUE CROSS/BLUE SHIELD

## 2017-11-28 VITALS — BP 112/70 | HR 72 | Ht 63.0 in | Wt 146.0 lb

## 2017-11-28 DIAGNOSIS — R102 Pelvic and perineal pain: Secondary | ICD-10-CM

## 2017-11-28 NOTE — Progress Notes (Addendum)
Obstetrics & Gynecology Office Visit   Chief Complaint:  Chief Complaint  Patient presents with  . Follow-up    u/s results    History of Present Illness: She has been experiencing intermittent pelvic pain on the right side for approximately the last six months. The pain is described as moderate, cramping, worse then menstrual cramps, occurring once in a while, not associated with her cycle. Nothing makes it better or worse.  Review of Systems: Positive for anxiety/depression, otherwise 10 point review of systems negative except as noted in HPI.  Past Medical History:  Past Medical History:  Diagnosis Date  . Anxiety   . Depression   . Menorrhagia   . Orthostatic hypotension     Past Surgical History:  Past Surgical History:  Procedure Laterality Date  . TONSILLECTOMY AND ADENOIDECTOMY  05/2013   at age 22    Gynecologic History: Patient's last menstrual period was 11/12/2017 (approximate).  Obstetric History: G1P1001  Family History:  Family History  Problem Relation Age of Onset  . Hypertension Father   . Stroke Father   . Cancer Maternal Grandfather [id number]       kidney  . Hypertension Maternal Grandfather   . Cancer Paternal Grandmother        ovarian  . Hypertension Mother   . Diabetes Paternal Grandfather   . Cancer Other 65       BREAST  . Endometriosis Other   . Ovarian cancer Other   . Cervical cancer Other     Social History:  Social History   Socioeconomic History  . Marital status: Single    Spouse name: Not on file  . Number of children: 1  . Years of education: Not on file  . Highest education level: Not on file  Social Needs  . Financial resource strain: Not on file  . Food insecurity - worry: Not on file  . Food insecurity - inability: Not on file  . Transportation needs - medical: Not on file  . Transportation needs - non-medical: Not on file  Occupational History  . Occupation: RETAIL Investment banker, corporateALES    Employer: [employer]  Tobacco Use  .  Smoking status: Former Smoker    Packs/day: 0.50    Types: Cigarettes  . Smokeless tobacco: Never Used  Substance and Sexual Activity  . Alcohol use: No    Alcohol/week: 0.0 oz    Frequency: Never    Comment: 3-4 mixed drinks per week prior to pregnancy  . Drug use: No  . Sexual activity: Yes    Birth control/protection: IUD    Comment: Depo shot  Other Topics Concern  . Not on file  Social History Narrative  . Not on file    Allergies:  No Known Allergies  Medications: Prior to Admission medications   Medication Sig Start Date End Date Taking? Authorizing Provider  levonorgestrel (MIRENA) 20 MCG/24HR IUD 1 each by Intrauterine route once.    [provider]    Physical Exam Vitals:  Vitals:   11/28/17 1137  BP: 112/70  Pulse: 72   Patient's last menstrual period was 11/12/2017 (approximate).  General: NAD Neurologic: Grossly intact Psychiatric: mood appropriate, affect full  Assessment: 22 y.o. G1P1001 with pelvic pain and normal pelvic ultrasound.  Plan: Problem List Items Addressed This Visit    None    Visit Diagnoses    Pelvic pain    -  Primary     1) Pelvic ultrasound today was within normal limits and  showed Mirena IUD in correct location.    2) Discussed with patient that ultrasound is normal but cannot rule out all causes of pelvic pain such as endometriosis.  In addition, we discussed the possibility of non-gynecologic etiologies such as urinary or GI tract pathology or disorders, as well as musculoskeletal problems.   3) Advised treating symptoms with NSAIDs and heat.  Return to care if symptoms worsen.  A total of 10 minutes were spent in face-to-face contact with the patient during this encounter with over half of that time devoted to counseling and coordination of care.  Marcelyn Bruins, CNM 11/28/2017  11:52 AM

## 2018-07-02 DIAGNOSIS — L578 Other skin changes due to chronic exposure to nonionizing radiation: Secondary | ICD-10-CM | POA: Diagnosis not present

## 2018-07-02 DIAGNOSIS — D485 Neoplasm of uncertain behavior of skin: Secondary | ICD-10-CM | POA: Diagnosis not present

## 2018-07-02 DIAGNOSIS — Z86018 Personal history of other benign neoplasm: Secondary | ICD-10-CM | POA: Diagnosis not present

## 2019-01-07 ENCOUNTER — Other Ambulatory Visit: Payer: Self-pay

## 2019-01-07 ENCOUNTER — Ambulatory Visit: Payer: BLUE CROSS/BLUE SHIELD | Admitting: Family Medicine

## 2019-01-07 ENCOUNTER — Encounter: Payer: Self-pay | Admitting: Family Medicine

## 2019-01-07 ENCOUNTER — Other Ambulatory Visit (HOSPITAL_COMMUNITY)
Admission: RE | Admit: 2019-01-07 | Discharge: 2019-01-07 | Disposition: A | Discharge status: Home / Self Care | Payer: BLUE CROSS/BLUE SHIELD | Source: Ambulatory Visit | Attending: Family Medicine | Admitting: Family Medicine

## 2019-01-07 VITALS — BP 120/72 | HR 100 | Temp 98.8°F | Resp 16 | Ht 63.0 in | Wt 151.3 lb

## 2019-01-07 DIAGNOSIS — Z113 Encounter for screening for infections with a predominantly sexual mode of transmission: Secondary | ICD-10-CM | POA: Insufficient documentation

## 2019-01-07 DIAGNOSIS — R6889 Other general symptoms and signs: Secondary | ICD-10-CM | POA: Insufficient documentation

## 2019-01-07 DIAGNOSIS — Z1159 Encounter for screening for other viral diseases: Secondary | ICD-10-CM

## 2019-01-07 DIAGNOSIS — Z01419 Encounter for gynecological examination (general) (routine) without abnormal findings: Secondary | ICD-10-CM | POA: Insufficient documentation

## 2019-01-07 DIAGNOSIS — R238 Other skin changes: Secondary | ICD-10-CM | POA: Diagnosis not present

## 2019-01-07 DIAGNOSIS — F321 Major depressive disorder, single episode, moderate: Secondary | ICD-10-CM

## 2019-01-07 DIAGNOSIS — Z13220 Encounter for screening for lipoid disorders: Secondary | ICD-10-CM | POA: Diagnosis not present

## 2019-01-07 DIAGNOSIS — F411 Generalized anxiety disorder: Secondary | ICD-10-CM

## 2019-01-07 DIAGNOSIS — R233 Spontaneous ecchymoses: Secondary | ICD-10-CM

## 2019-01-07 DIAGNOSIS — Z131 Encounter for screening for diabetes mellitus: Secondary | ICD-10-CM | POA: Diagnosis not present

## 2019-01-07 NOTE — Patient Instructions (Addendum)
[email or web address] therapist finder  Here are some resources to help you if you feel you are in a mental health crisis:  [facility] - Call [phone]  for help - Website with more resources: GripTrip.com.pt  [facility] - Call [phone] for help. - Mobile Crisis Program available 24 hours a day, 365 days a year. - Available for anyone of any age in [place] counties.  [facility] - Address: [address], [place] - Telephone: [phone]  - Hours of Operation: Sunday - Saturday - 8:00 a.m. - 8:00 p.m. - Medicaid, Medicare (Government Issued Only), BCBS, and [place] [facility], [place], Psychiatrists on-site to provide medication management, [place], and Peer Support Care.  [facility] - Call [phone] for help. - Mobile Crisis Program available 24 hours a day, 365 days a year. - Available for anyone of any age in [place] 18-39 Years, Female Preventive care refers to lifestyle choices and visits with your health care provider that can promote health and wellness. What does preventive care include?   A yearly physical exam. This is also called an annual well check.  Dental exams once or twice a year.  Routine eye exams. Ask your health care provider how often you should have your eyes checked.  Personal lifestyle choices, including: ? Daily care of your teeth and gums. ? Regular physical activity. ? Eating a healthy diet. ? Avoiding tobacco and drug use. ? Limiting alcohol use. ? Practicing safe sex. ? Taking vitamin and mineral supplements as recommended by your health care provider. What happens during an annual well check? The services and screenings done by your health care provider during your annual  well check will depend on your age, overall health, lifestyle risk factors, and family history of disease. Counseling Your health care provider may ask you questions about your:  Alcohol use.  Tobacco use.  Drug use.  Emotional well-being.  Home and relationship well-being.  Sexual activity.  Eating habits.  Work and work Statistician.  Method of birth control.  Menstrual cycle.  Pregnancy history. Screening You may have the following tests or measurements:  Height, weight, and BMI.  Diabetes screening. This is done by checking your blood sugar (glucose) after you have not eaten for a while (fasting).  Blood pressure.  Lipid and cholesterol levels. These may be checked every 5 years starting at age 19.  Skin check.  Hepatitis C blood test.  Hepatitis B blood test.  Sexually transmitted disease (STD) testing.  BRCA-related cancer screening. This may be done if you have a family history of breast, ovarian, tubal, or peritoneal cancers.  Pelvic exam and Pap test. This may be done every 3 years starting at age 80. Starting at age 14, this may be done every 5 years if you have a Pap test in combination with an HPV test. Discuss your test results, treatment options, and if necessary, the need for more tests with your health care provider. Vaccines Your health care provider may recommend certain vaccines, such as:  Influenza vaccine. This is recommended every year.  Tetanus, diphtheria, and acellular pertussis (Tdap, Td) vaccine. You may need a Td booster every 10 years.  Varicella vaccine. You may need this if you have not been vaccinated.  HPV vaccine. If you are 89 or younger, you may need three doses over 6 months.  Measles,  mumps, and rubella (MMR) vaccine. You may need at least one dose of MMR. You may also need a second dose.  Pneumococcal 13-valent conjugate (PCV13) vaccine. You may need this if you have certain conditions and were not previously  vaccinated.  Pneumococcal polysaccharide (PPSV23) vaccine. You may need one or two doses if you smoke cigarettes or if you have certain conditions.  Meningococcal vaccine. One dose is recommended if you are age 23-21 years and a first-year college student living in a residence hall, or if you have one of several medical conditions. You may also need additional booster doses.  Hepatitis A vaccine. You may need this if you have certain conditions or if you travel or work in places where you may be exposed to hepatitis A.  Hepatitis B vaccine. You may need this if you have certain conditions or if you travel or work in places where you may be exposed to hepatitis B.  Haemophilus influenzae type b (Hib) vaccine. You may need this if you have certain risk factors. Talk to your health care provider about which screenings and vaccines you need and how often you need them. This information is not intended to replace advice given to you by your health care provider. Make sure you discuss any questions you have with your health care provider. Document Released: 12/19/2001 Document Revised: 06/05/2017 Document Reviewed: 08/24/2015 Elsevier Interactive Patient Education  2019 [employer].

## 2019-01-07 NOTE — Assessment & Plan Note (Signed)
Declines medication, will seek counseling.

## 2019-01-07 NOTE — Assessment & Plan Note (Signed)
Will check labs; was donating plasma, advised to stop for now.

## 2019-01-07 NOTE — Progress Notes (Signed)
Name: Mandy Reeves   MRN: [id number]    DOB: 25-Oct-1996   Date:01/07/2019       Progress Note  Subjective  Chief Complaint  Chief Complaint  Patient presents with  . Annual Exam  . Establish Care    HPI  Patient presents for annual CPE and to establish care.  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  Diet: She eats out >5 times a week; will eat at home the rest of the time.  Exercise: She does not exercise. Plays with her daughter daily.  USPSTF grade A and B recommendations Once a week - wine with dinner   [facility] Visit from 01/07/2019 in [facility]  AUDIT-C Score  1     Depression: She used to go to [facility] for anxiety classes and counseling, lost touch and never returned, has struggled her whole life with low self esteem.  She has a 50yo daughter and she says she is still dealing with some decisions she has made in her life that are now affecting herself and her daughter. Depression screen PHQ 2/9 01/07/2019  Decreased Interest 2  Down, Depressed, Hopeless 2  PHQ - 2 Score 4  Altered sleeping 3  Tired, decreased energy 1  Change in appetite 2  Feeling bad or failure about yourself  3  Trouble concentrating 1  Moving slowly or fidgety/restless 1  Suicidal thoughts 0  PHQ-9 Score 15  Difficult doing work/chores Somewhat difficult   Hypertension: BP Readings from Last 3 Encounters:  01/07/19 120/72  11/28/17 112/70  11/19/17 116/80   Obesity: Wt Readings from Last 3 Encounters:  01/07/19 151 lb 4.8 oz (68.6 kg)  11/28/17 146 lb (66.2 kg)  11/19/17 146 lb (66.2 kg)   BMI Readings from Last 3 Encounters:  01/07/19 26.80 kg/m  11/28/17 25.86 kg/m  11/19/17 25.86 kg/m    Hep C Screening: We will check today STD testing and prevention (HIV/chl/gon/syphilis): She is sexually active, 2 partners in the last year. We will check today.  She notes thicker and increased amount of discharge recently. Intimate partner violence: No cocnerns Sexual  History/Pain during Intercourse: Does have occasional pain with new partner, possibly positional Menstrual History/LMP/Abnormal Bleeding: Has Mirena in place. Has occasional spotting Incontinence Symptoms: No concerns  Advanced Care Planning: A voluntary discussion about advance care planning including the explanation and discussion of advance directives.  Discussed health care proxy and Living will, and the patient was able to identify a health care proxy as Mother - Doneen Poisson.  Patient does not have a living will at present time. If patient does have living will, I have requested they bring this to the clinic to be scanned in to their chart.  Breast cancer: Thinks there is remote paternal family history.  No concerns today No results found for: HMMAMMO  BRCA gene screening: N/A Cervical cancer screening: Normal Pap in 2019.  Osteoporosis Screening: Discussed adequate vitamin D - sun exposure, weight bearing exercises. No results found for: HMDEXASCAN  Lipids: Not indicated Glucose:  Glucose  Date Value Ref Range Status  05/18/2014 88 65 - 99 mg/dL Final   Glucose, Bld  Date Value Ref Range Status  02/02/2016 101 (H) 65 - 99 mg/dL Final  01/28/2016 105 (H) 65 - 99 mg/dL Final  01/24/2013 81 70 - 99 mg/dL Final    Skin cancer: Has history of precancerous lesions - is seeing dermatology this month for follow up. Colorectal cancer: Denies family or personal history  of colorectal cancer, no changes in BM's - no blood in stool, dark and tarry stool, mucus in stool. Does have constipation occasionally, no diarrhea, no abdominal pain. Lung cancer: Low Dose CT Chest recommended if Age 77-80 years, 30 pack-year currently smoking OR have quit w/in 15years. Patient does not qualify.   ECG: Denies chest pain/shortness of breath/palpitations  Patient Active Problem List   Diagnosis Date Noted  . Sprain of left ankle 04/23/2017  . Closed nondisplaced fracture of medial malleolus of left tibia  04/20/2017  . Exposure to influenza 01/11/2016  . MDD (major depressive disorder), single episode, moderate ([place]) 01/27/2013  . GAD (generalized anxiety disorder) 01/27/2013  . ADHD (attention deficit hyperactivity disorder), predominantly hyperactive impulsive type 01/27/2013    Past Surgical History:  Procedure Laterality Date  . TONSILLECTOMY AND ADENOIDECTOMY  05/2013   at age 89    Family History  Problem Relation Age of Onset  . Hypertension Father   . Stroke Father   . Cancer Maternal Grandfather 92       kidney  . Hypertension Maternal Grandfather   . Cancer Paternal Grandmother        ovarian  . Hypertension Mother   . Diabetes Paternal Grandfather   . Cancer Other 65       BREAST  . Endometriosis Other   . Ovarian cancer Other   . Cervical cancer Other     Social History   Socioeconomic History  . Marital status: Single    Spouse name: Not on file  . Number of children: 1  . Years of education: Not on file  . Highest education level: Not on file  Occupational History  . Occupation: RETAIL Scientist, clinical (histocompatibility and immunogenetics): [employer]  . Financial resource strain: Not hard at all  . Food insecurity:    Worry: Never true    Inability: Never true  . Transportation needs:    Medical: No    Non-medical: No  Tobacco Use  . Smoking status: Former Smoker    Packs/day: 0.50    Types: Cigarettes  . Smokeless tobacco: Never Used  Substance and Sexual Activity  . Alcohol use: No    Frequency: Never    Comment: 3-4 mixed drinks per week prior to pregnancy  . Drug use: No  . Sexual activity: Yes    Birth control/protection: I.U.D.  Lifestyle  . Physical activity:    Days per week: 0 days    Minutes per session: 0 min  . Stress: Not at all  Relationships  . Social connections:    Talks on phone: More than three times a week    Gets together: More than three times a week    Attends religious service: Never    Active member of club or organization: No     Attends meetings of clubs or organizations: Never    Relationship status: Never married  . Intimate partner violence:    Fear of current or ex partner: No    Emotionally abused: No    Physically abused: No    Forced sexual activity: No  Other Topics Concern  . Not on file  Social History Narrative  . Not on file    Current Outpatient Medications:  .  levonorgestrel (MIRENA) 20 MCG/24HR IUD, 1 each by Intrauterine route once., Disp: , Rfl:   No Known Allergies   ROS  Constitutional: Negative for fever or weight change.  Respiratory: Negative for cough and shortness of breath.  Cardiovascular: Negative for chest pain or palpitations.  Gastrointestinal: Negative for abdominal pain, no bowel changes.  Musculoskeletal: Negative for gait problem or joint swelling.  Skin: Negative for rash.  Neurological: Negative for dizziness or headache.  No other specific complaints in a complete review of systems (except as listed in HPI above).  Objective  Vitals:   01/07/19 0933  BP: 120/72  Pulse: 100  Resp: 16  Temp: 98.8 F (37.1 C)  TempSrc: Oral  SpO2: 99%  Weight: 151 lb 4.8 oz (68.6 kg)  Height: '5\' 3"'$  (1.6 m)    Body mass index is 26.8 kg/m.  Physical Exam  Constitutional: Patient appears well-developed and well-nourished. No distress.  HENT: Head: Normocephalic and atraumatic. Ears: B TMs ok, no erythema or effusion; Nose: Nose normal. Mouth/Throat: Oropharynx is clear and moist. No oropharyngeal exudate.  Eyes: Conjunctivae and EOM are normal. Pupils are equal, round, and reactive to light. No scleral icterus.  Neck: Normal range of motion. Neck supple. No JVD present. No thyromegaly present.  Cardiovascular: Normal rate, regular rhythm and normal heart sounds.  No murmur heard. No BLE edema. Pulmonary/Chest: Effort normal and breath sounds normal. No respiratory distress. Abdominal: Soft. Bowel sounds are normal, no distension. There is no tenderness. no  masses Breast: no lumps or masses, no nipple discharge or rashes FEMALE GENITALIA:  External genitalia normal External urethra normal Vaginal vault normal without discharge or lesions Cervix normal without discharge or lesions Bimanual exam normal without masses RECTAL: no rectal masses or hemorrhoids Musculoskeletal: Normal range of motion, no joint effusions. No gross deformities Neurological: he is alert and oriented to person, place, and time. No cranial nerve deficit. Coordination, balance, strength, speech and gait are normal.  Skin: Skin is warm and dry. No rash noted. No erythema.  Psychiatric: Patient has a normal mood and affect. behavior is normal. Judgment and thought content normal.  No results found for this or any previous visit (from the past 2160 hour(s)).  PHQ2/9: Depression screen PHQ 2/9 01/07/2019  Decreased Interest 2  Down, Depressed, Hopeless 2  PHQ - 2 Score 4  Altered sleeping 3  Tired, decreased energy 1  Change in appetite 2  Feeling bad or failure about yourself  3  Trouble concentrating 1  Moving slowly or fidgety/restless 1  Suicidal thoughts 0  PHQ-9 Score 15  Difficult doing work/chores Somewhat difficult   Fall Risk: Fall Risk  01/07/2019  Falls in the past year? 0  Number falls in past yr: 0  Injury with Fall? 0  Follow up Falls evaluation completed    Assessment & Plan  1. Well woman exam -USPSTF grade A and B recommendations reviewed with patient; age-appropriate recommendations, preventive care, screening tests, etc discussed and encouraged; healthy living encouraged; see AVS for patient education given to patient -Discussed importance of 150 minutes of physical activity weekly, eat two servings of fish weekly, eat one serving of tree nuts ( cashews, pistachios, pecans, almonds.Marland Kitchen) every other day, eat 6 servings of fruit/vegetables daily and drink plenty of water and avoid sweet beverages.  - Hepatitis C antibody - RPR - HIV Antibody  (routine testing w rflx) - Cervicovaginal ancillary only - Flu Vaccine QUAD 6+ mos PF IM (Fluarix Quad PF) - COMPLETE METABOLIC PANEL WITH GFR - Lipid panel  2. Routine screening for STI (sexually transmitted infection - RPR - HIV Antibody (routine testing w rflx) - Cervicovaginal ancillary only  3. Need for hepatitis C screening test - Hepatitis C antibody  4. Diabetes mellitus screening - COMPLETE METABOLIC PANEL WITH GFR  5. Easy bruising - CBC w/Diff/Platelet  6. Cold intolerance - CBC w/Diff/Platelet - TSH  7. Lipid screening - Lipid panel  8. Current moderate episode of major depressive disorder without prior episode ([place]) - Declines medication today, wants to seek counseling.

## 2019-01-07 NOTE — Assessment & Plan Note (Signed)
Declines medication, will seek counseling. 

## 2019-01-08 LAB — CBC WITH DIFFERENTIAL/PLATELET
ABSOLUTE MONOCYTES: 533 {cells}/uL (ref 200–950)
BASOS PCT: 1 %
Basophils Absolute: 71 cells/uL (ref 0–200)
Eosinophils Absolute: 142 cells/uL (ref 15–500)
Eosinophils Relative: 2 %
HCT: 41.9 % (ref 35.0–45.0)
HEMOGLOBIN: 14.1 g/dL (ref 11.7–15.5)
Lymphs Abs: 1995 cells/uL (ref 850–3900)
MCH: 30.1 pg (ref 27.0–33.0)
MCHC: 33.7 g/dL (ref 32.0–36.0)
MCV: 89.5 fL (ref 80.0–100.0)
MPV: 10.9 fL (ref 7.5–12.5)
Monocytes Relative: 7.5 %
NEUTROS ABS: 4359 {cells}/uL (ref 1500–7800)
Neutrophils Relative %: 61.4 %
PLATELETS: 298 10*3/uL (ref 140–400)
RBC: 4.68 10*6/uL (ref 3.80–5.10)
RDW: 12 % (ref 11.0–15.0)
TOTAL LYMPHOCYTE: 28.1 %
WBC: 7.1 10*3/uL (ref 3.8–10.8)

## 2019-01-08 LAB — HEPATITIS C ANTIBODY
Hepatitis C Ab: NONREACTIVE
SIGNAL TO CUT-OFF: 0.03 (ref <1.00)

## 2019-01-08 LAB — RPR: RPR Ser Ql: NONREACTIVE

## 2019-01-08 LAB — COMPLETE METABOLIC PANEL WITH GFR
AG RATIO: 2.5 (calc) (ref 1.0–2.5)
ALKALINE PHOSPHATASE (APISO): 66 U/L (ref 31–125)
ALT: 9 U/L (ref 6–29)
AST: 14 U/L (ref 10–30)
Albumin: 4.9 g/dL (ref 3.6–5.1)
BILIRUBIN TOTAL: 0.5 mg/dL (ref 0.2–1.2)
BUN: 18 mg/dL (ref 7–25)
CHLORIDE: 104 mmol/L (ref 98–110)
CO2: 24 mmol/L (ref 20–32)
Calcium: 10.1 mg/dL (ref 8.6–10.2)
Creat: 0.77 mg/dL (ref 0.50–1.10)
GFR, EST AFRICAN AMERICAN: 127 mL/min/{1.73_m2} (ref >=60)
GFR, Est Non African American: 110 mL/min/{1.73_m2} (ref >=60)
GLOBULIN: 2 g/dL (ref 1.9–3.7)
Glucose, Bld: 83 mg/dL (ref 65–99)
Potassium: 4.5 mmol/L (ref 3.5–5.3)
SODIUM: 138 mmol/L (ref 135–146)
TOTAL PROTEIN: 6.9 g/dL (ref 6.1–8.1)

## 2019-01-08 LAB — LIPID PANEL
Cholesterol: 175 mg/dL (ref <200)
HDL: 59 mg/dL (ref >=50)
LDL CHOLESTEROL (CALC): 101 mg/dL — AB
Non-HDL Cholesterol (Calc): 116 mg/dL (calc) (ref <130)
Total CHOL/HDL Ratio: 3 (calc) (ref <5.0)
Triglycerides: 66 mg/dL (ref <150)

## 2019-01-08 LAB — TSH: TSH: 0.97 mIU/L

## 2019-01-08 LAB — HIV ANTIBODY (ROUTINE TESTING W REFLEX): HIV 1&2 Ab, 4th Generation: NONREACTIVE

## 2019-01-09 ENCOUNTER — Other Ambulatory Visit: Payer: Self-pay | Admitting: Family Medicine

## 2019-01-09 DIAGNOSIS — B373 Candidiasis of vulva and vagina: Secondary | ICD-10-CM

## 2019-01-09 DIAGNOSIS — B3731 Acute candidiasis of vulva and vagina: Secondary | ICD-10-CM

## 2019-01-09 LAB — CERVICOVAGINAL ANCILLARY ONLY
Bacterial vaginitis: NEGATIVE
CANDIDA VAGINITIS: POSITIVE — AB
Chlamydia: NEGATIVE
Neisseria Gonorrhea: NEGATIVE
TRICH (WINDOWPATH): NEGATIVE

## 2019-01-09 MED ORDER — FLUCONAZOLE 150 MG PO TABS: 150.0000 mg | ORAL_TABLET | Freq: Once | ORAL | 1 refills | Status: AC

## 2019-01-27 DIAGNOSIS — D225 Melanocytic nevi of trunk: Secondary | ICD-10-CM | POA: Diagnosis not present

## 2019-02-11 DIAGNOSIS — D2261 Melanocytic nevi of right upper limb, including shoulder: Secondary | ICD-10-CM | POA: Diagnosis not present

## 2019-02-25 DIAGNOSIS — D2261 Melanocytic nevi of right upper limb, including shoulder: Secondary | ICD-10-CM | POA: Diagnosis not present

## 2019-04-03 DIAGNOSIS — B078 Other viral warts: Secondary | ICD-10-CM | POA: Diagnosis not present

## 2019-04-03 DIAGNOSIS — L910 Hypertrophic scar: Secondary | ICD-10-CM | POA: Diagnosis not present

## 2019-04-03 DIAGNOSIS — L578 Other skin changes due to chronic exposure to nonionizing radiation: Secondary | ICD-10-CM | POA: Diagnosis not present

## 2019-04-03 DIAGNOSIS — Z86018 Personal history of other benign neoplasm: Secondary | ICD-10-CM | POA: Diagnosis not present

## 2019-07-10 ENCOUNTER — Ambulatory Visit: Payer: BLUE CROSS/BLUE SHIELD | Admitting: Family Medicine

## 2019-09-18 ENCOUNTER — Other Ambulatory Visit: Payer: Self-pay

## 2019-09-18 ENCOUNTER — Ambulatory Visit (INDEPENDENT_AMBULATORY_CARE_PROVIDER_SITE_OTHER): Payer: BC Managed Care – PPO | Admitting: Family Medicine

## 2019-09-18 ENCOUNTER — Encounter: Payer: Self-pay | Admitting: Family Medicine

## 2019-09-18 ENCOUNTER — Other Ambulatory Visit (HOSPITAL_COMMUNITY)
Admission: RE | Admit: 2019-09-18 | Discharge: 2019-09-18 | Disposition: A | Discharge status: Home / Self Care | Payer: Managed Care, Other (non HMO) | Source: Ambulatory Visit | Attending: Family Medicine | Admitting: Family Medicine

## 2019-09-18 VITALS — BP 110/70 | HR 100 | Temp 97.7°F | Resp 16 | Ht 63.0 in | Wt 144.8 lb

## 2019-09-18 DIAGNOSIS — N898 Other specified noninflammatory disorders of vagina: Secondary | ICD-10-CM | POA: Insufficient documentation

## 2019-09-18 DIAGNOSIS — F411 Generalized anxiety disorder: Secondary | ICD-10-CM | POA: Diagnosis not present

## 2019-09-18 DIAGNOSIS — Z113 Encounter for screening for infections with a predominantly sexual mode of transmission: Secondary | ICD-10-CM

## 2019-09-18 DIAGNOSIS — F321 Major depressive disorder, single episode, moderate: Secondary | ICD-10-CM | POA: Diagnosis not present

## 2019-09-18 NOTE — Progress Notes (Signed)
Name: Mandy Reeves   MRN: [id number]    DOB: July 06, 1996   Date:09/18/2019       Progress Note  Subjective  Chief Complaint  Chief Complaint  Patient presents with  . Vaginal Discharge    odor, new partner    HPI  PT presents with concern for abnormal vaginal discharge and odor that started 1 day after unprotected intercourse with a new partner last weekend.  She denies abdominal pain, vaginal pain, vaginal itching, or vaginal bleeding, dysuria, or frank hematuria.  Discharge is described as greenish/yellow and thick.    Depression/Anxiety: She has dealt with this for much of her life, she used to go to [place] for counseling but lost touch.  She also notes a week of inpatient care at age 23yo.  She was on medication at the time but hated the way it made her feel.  She lives with her mom and step-dad who help her care for her 12yo daughter. She struggles with boundaries with them, and struggles with feeling like they treat her daughter significantly better than they treated her when she was a child.  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 PHQ-9 score is positive; we will refer to psychiatry for care.   Office Visit from 09/18/2019 in [facility]  PHQ-9 Total Score  14      Patient Active Problem List   Diagnosis Date Noted  . Easy bruising 01/07/2019  . Cold intolerance 01/07/2019  . Current moderate episode of major depressive disorder without prior episode ([place]) 01/27/2013  . GAD (generalized anxiety disorder) 01/27/2013  . ADHD (attention deficit hyperactivity disorder), predominantly hyperactive impulsive type 01/27/2013    Social History   Tobacco Use  . Smoking status: Former Smoker    Packs/day: 1.00    Years: 10.00    Pack years: 10.00    Types: Cigarettes  . Smokeless tobacco: Never Used  . Tobacco comment: Quit 2019; Started at age 23  Substance Use Topics  . Alcohol use: No    Frequency: Never    Comment: 3-4 mixed drinks per week prior  to pregnancy     Current Outpatient Medications:  .  levonorgestrel (MIRENA) 20 MCG/24HR IUD, 1 each by Intrauterine route once., Disp: , Rfl:   No Known Allergies  I personally reviewed active problem list, medication list, allergies, notes from last encounter, lab results with the patient/caregiver today.  ROS  Ten systems reviewed and is negative except as mentioned in HPI  Objective  Vitals:   09/18/19 1034  BP: 110/70  Pulse: 100  Resp: 16  Temp: 97.7 F (36.5 C)  TempSrc: Temporal  SpO2: 99%  Weight: 144 lb 12.8 oz (65.7 kg)  Height: 5\' 3"  (1.6 m)    Body mass index is 25.65 kg/m.  Nursing Note and Vital Signs reviewed.  Physical Exam  Constitutional: Patient appears well-developed and well-nourished. No distress.  HENT: Head: Normocephalic and atraumatic. Eyes: Conjunctivae and EOM are normal.   Neck: Normal range of motion. Neck supple. No JVD present.  Cardiovascular: Normal rate, regular rhythm and normal heart sounds.  No murmur heard. No BLE edema. Pulmonary/Chest: Effort normal and breath sounds normal. No respiratory distress. Abdominal: Soft. Bowel sounds are normal, no distension. There is no tenderness. no masses Breast: no lumps or masses, no nipple discharge or rashes FEMALE GENITALIA:  External genitalia normal External urethra normal Vaginal vault normal without discharge or lesions Cervix normal without discharge or lesions Bimanual exam normal  without masses RECTAL: no rectal masses or hemorrhoids Musculoskeletal: Normal range of motion, no joint effusions. No gross deformities Neurological: he is alert and oriented to person, place, and time. No cranial nerve deficit. Coordination, balance, strength, speech and gait are normal.  Skin: Skin is warm and dry. No rash noted. No erythema.  Psychiatric: Patient has a normal mood and affect. behavior is normal. Judgment and thought content normal.  No results found for this or any previous  visit (from the past 72 hour(s)).  Assessment & Plan  1. Vaginal discharge - Recommend consistent condom use for prevention of STI - Cervicovaginal ancillary only  2. Vaginal odor - Cervicovaginal ancillary only  3. GAD (generalized anxiety disorder) - Ambulatory referral to Psychiatry  4. Current moderate episode of major depressive disorder without prior episode [facility]) - Ambulatory referral to Psychiatry  5. Routine screening for STI (sexually transmitted infection) - Cervicovaginal ancillary only - HIV Antibody (routine testing w rflx) - RPR  -Red flags and when to present for emergency care or RTC including fever >101.25F, chest pain, shortness of breath, new/worsening/un-resolving symptoms, reviewed with patient at time of visit. Follow up and care instructions discussed and provided in AVS.

## 2019-09-19 ENCOUNTER — Other Ambulatory Visit: Payer: Self-pay | Admitting: Family Medicine

## 2019-09-19 DIAGNOSIS — B3731 Acute candidiasis of vulva and vagina: Secondary | ICD-10-CM

## 2019-09-19 DIAGNOSIS — B373 Candidiasis of vulva and vagina: Secondary | ICD-10-CM

## 2019-09-19 LAB — CERVICOVAGINAL ANCILLARY ONLY
Bacterial Vaginitis (gardnerella): NEGATIVE
Candida Glabrata: NEGATIVE
Candida Vaginitis: POSITIVE — AB
Chlamydia: NEGATIVE
Comment: NEGATIVE
Comment: NEGATIVE
Comment: NEGATIVE
Comment: NEGATIVE
Comment: NEGATIVE
Comment: NORMAL
Neisseria Gonorrhea: NEGATIVE
Trichomonas: NEGATIVE

## 2019-09-19 LAB — RPR: RPR Ser Ql: NONREACTIVE

## 2019-09-19 LAB — HIV ANTIBODY (ROUTINE TESTING W REFLEX): HIV 1&2 Ab, 4th Generation: NONREACTIVE

## 2019-09-19 MED ORDER — FLUCONAZOLE 150 MG PO TABS: 150.0000 mg | ORAL_TABLET | Freq: Once | ORAL | 0 refills | Status: AC

## 2020-02-05 HISTORY — PX: WISDOM TOOTH EXTRACTION: SHX21

## 2020-02-10 DIAGNOSIS — F410 Panic disorder [episodic paroxysmal anxiety] without agoraphobia: Secondary | ICD-10-CM | POA: Insufficient documentation

## 2020-03-10 NOTE — Progress Notes (Signed)
Mandy Hartshorn, Mandy Reeves   Chief Complaint  Patient presents with  . IUD removal    not interested in [facility]    HPI:      Mandy Reeves is a 24 y.o. G1P1001 whose LMP was No LMP recorded. (Menstrual status: IUD)., presents today for IUD removal for possible conception and increased vag d/c with odor, no irritation. Was seen a couple months ago by PCP and treated for yeast vag with diflucan. Sx improved for a short time and then recurred. No meds to treat recently. No prior abx use. Uses mult scented soaps.  She is sex active, no new partners. Possible conception in near future. Not taking PNVs. Has random light spotting with IUD, occas dysmen. No dyspareunia. Mirena placed about 4 yrs ago.  Past Medical History:  Diagnosis Date  . Anxiety   . Closed nondisplaced fracture of medial malleolus of left tibia 04/20/2017  . Depression   . Menorrhagia   . Orthostatic hypotension   . Sprain of left ankle 04/23/2017    Past Surgical History:  Procedure Laterality Date  . TONSILLECTOMY AND ADENOIDECTOMY  05/2013   at age 57  . WISDOM TOOTH EXTRACTION  02/2020    Family History  Problem Relation Age of Onset  . Hypertension Father   . Stroke Father   . Cancer Maternal Grandfather 39       kidney  . Hypertension Maternal Grandfather   . Ovarian cancer Paternal Grandmother   . Hypertension Mother   . Diabetes Paternal Grandfather   . Cancer Other 65       BREAST  . Endometriosis Other   . Ovarian cancer Other   . Cervical cancer Other     Social History   Socioeconomic History  . Marital status: Single    Spouse name: Not on file  . Number of children: 1  . Years of education: Not on file  . Highest education level: Not on file  Occupational History  . Occupation: RETAIL Scientist, clinical (histocompatibility and immunogenetics): [id number]  Tobacco Use  . Smoking status: Former Smoker    Packs/day: 1.00    Years: 10.00    Pack years: 10.00    Types: Cigarettes  . Smokeless tobacco: Never Used  . Tobacco  comment: Quit 2019; Started at age 68  Substance and Sexual Activity  . Alcohol use: No    Comment: 3-4 mixed drinks per week prior to pregnancy  . Drug use: No  . Sexual activity: Yes    Birth control/protection: I.U.D.    Comment: Mirena  Other Topics Concern  . Not on file  Social History Narrative   Has 51yo daughter; lives with her mother and stepfather and her daughter.   Social Determinants of Health   Financial Resource Strain:   . Difficulty of Paying Living Expenses:   Food Insecurity:   . Worried About Charity fundraiser in the Last Year:   . Arboriculturist in the Last Year:   Transportation Needs:   . Film/video editor (Medical):   Marland Kitchen Lack of Transportation (Non-Medical):   Physical Activity:   . Days of Exercise per Week:   . Minutes of Exercise per Session:   Stress:   . Feeling of Stress :   Social Connections:   . Frequency of Communication with Friends and Family:   . Frequency of Social Gatherings with Friends and Family:   . Attends Religious Services:   . Active  Member of Clubs or Organizations:   . Attends Banker Meetings:   Marland Kitchen Marital Status:   Intimate Partner Violence:   . Fear of Current or Ex-Partner:   . Emotionally Abused:   Marland Kitchen Physically Abused:   . Sexually Abused:     Outpatient Medications Prior to Visit  Medication Sig Dispense Refill  . levonorgestrel (MIRENA) 20 MCG/24HR IUD 1 each by Intrauterine route once.     No facility-administered medications prior to visit.      ROS:  Review of Systems  Constitutional: Negative for fever.  Gastrointestinal: Negative for blood in stool, constipation, diarrhea, nausea and vomiting.  Genitourinary: Positive for vaginal discharge. Negative for dyspareunia, dysuria, flank pain, frequency, hematuria, urgency, vaginal bleeding and vaginal pain.  Musculoskeletal: Negative for back pain.  Skin: Negative for rash.   BREAST: No symptoms   OBJECTIVE:   Vitals:  BP 120/70    Ht 5\' 3"  (1.6 m)   Wt 143 lb (64.9 kg)   Breastfeeding No   BMI 25.33 kg/m   Physical Exam Vitals reviewed.  Constitutional:      Appearance: She is well-developed.  Pulmonary:     Effort: Pulmonary effort is normal.  Genitourinary:    General: Normal vulva.     Pubic Area: No rash.      Labia:        Right: No rash, tenderness or lesion.        Left: No rash, tenderness or lesion.      Vagina: Normal. No vaginal discharge, erythema or tenderness.     Cervix: Normal.     Uterus: Normal. Not enlarged and not tender.      Adnexa: Right adnexa normal and left adnexa normal.       Right: No mass or tenderness.         Left: No mass or tenderness.    Musculoskeletal:        General: Normal range of motion.     Cervical back: Normal range of motion.  Skin:    General: Skin is warm and dry.  Neurological:     General: No focal deficit present.     Mental Status: She is alert and oriented to person, place, and time.  Psychiatric:        Mood and Affect: Mood normal.        Behavior: Behavior normal.        Thought Content: Thought content normal.        Judgment: Judgment normal.     Results: Results for orders placed or performed in visit on 03/11/20 (from the past 24 hour(s))  POCT Wet Prep with KOH     Status: Abnormal   Collection Time: 03/11/20 10:57 AM  Result Value Ref Range   Trichomonas, UA Negative    Clue Cells Wet Prep HPF POC pos    Epithelial Wet Prep HPF POC     Yeast Wet Prep HPF POC neg    Bacteria Wet Prep HPF POC     RBC Wet Prep HPF POC     WBC Wet Prep HPF POC     KOH Prep POC Positive (A) Negative   IUD Removal Strings of IUD identified and grasped.  IUD removed without problem with [place] forceps.  Pt tolerated this well.  IUD noted to be intact.   Assessment/Plan: Bacterial vaginosis - Plan: POCT Wet Prep with KOH, metroNIDAZOLE (FLAGYL) 500 MG tablet; pos sx and wet prep. Rx flagy, no EtOH.  Will RF if sx recur. Dove sens skin soap. F/u  prn.   Screening for STD (sexually transmitted disease) - Plan: Cervicovaginal ancillary only  Encounter for removal of intrauterine contraceptive device (IUD)--pt tolerated well.    Meds ordered this encounter  Medications  . metroNIDAZOLE (FLAGYL) 500 MG tablet    Sig: Take 1 tablet (500 mg total) by mouth 2 (two) times daily for 7 days.    Dispense:  14 tablet    Refill:  0    Order Specific Question:   Supervising Provider    Answer:   Nadara Mustard [[id number]]      Return if symptoms worsen or fail to improve.  Randee Upchurch B. Keevan Wolz, PA-C 03/11/2020 10:59 AM

## 2020-03-10 NOTE — Patient Instructions (Signed)
I value your feedback and entrusting us with your care. If you get a [facility] patient survey, I would appreciate you taking the time to let us know about your experience today. Thank you!  As of October 16, 2019, your lab results will be released to your MyChart immediately, before I even have a chance to see them. Please give me time to review them and contact you if there are any abnormalities. Thank you for your patience.  

## 2020-03-11 ENCOUNTER — Encounter: Payer: Self-pay | Admitting: Obstetrics and Gynecology

## 2020-03-11 ENCOUNTER — Ambulatory Visit (INDEPENDENT_AMBULATORY_CARE_PROVIDER_SITE_OTHER): Payer: BC Managed Care – PPO | Admitting: Obstetrics and Gynecology

## 2020-03-11 ENCOUNTER — Other Ambulatory Visit (HOSPITAL_COMMUNITY)
Admission: RE | Admit: 2020-03-11 | Discharge: 2020-03-11 | Disposition: A | Discharge status: Home / Self Care | Payer: Managed Care, Other (non HMO) | Source: Ambulatory Visit | Attending: Obstetrics and Gynecology | Admitting: Obstetrics and Gynecology

## 2020-03-11 ENCOUNTER — Other Ambulatory Visit: Payer: Self-pay

## 2020-03-11 VITALS — BP 120/70 | Ht 63.0 in | Wt 143.0 lb

## 2020-03-11 DIAGNOSIS — Z113 Encounter for screening for infections with a predominantly sexual mode of transmission: Secondary | ICD-10-CM | POA: Diagnosis not present

## 2020-03-11 DIAGNOSIS — N760 Acute vaginitis: Secondary | ICD-10-CM

## 2020-03-11 DIAGNOSIS — B9689 Other specified bacterial agents as the cause of diseases classified elsewhere: Secondary | ICD-10-CM | POA: Diagnosis not present

## 2020-03-11 DIAGNOSIS — Z30432 Encounter for removal of intrauterine contraceptive device: Secondary | ICD-10-CM

## 2020-03-11 LAB — POCT WET PREP WITH KOH
Clue Cells Wet Prep HPF POC: POSITIVE
KOH Prep POC: POSITIVE — AB
Trichomonas, UA: NEGATIVE
Yeast Wet Prep HPF POC: NEGATIVE

## 2020-03-11 MED ORDER — METRONIDAZOLE 500 MG PO TABS: 500.0000 mg | ORAL_TABLET | Freq: Two times a day (BID) | ORAL | 0 refills | Status: AC

## 2020-03-12 LAB — CERVICOVAGINAL ANCILLARY ONLY
Chlamydia: NEGATIVE
Comment: NEGATIVE
Comment: NORMAL
Neisseria Gonorrhea: NEGATIVE

## 2020-04-08 ENCOUNTER — Ambulatory Visit: Payer: BC Managed Care – PPO | Admitting: Family Medicine

## 2020-06-21 NOTE — Progress Notes (Deleted)
Mandy Custard, FNP   No chief complaint on file.   HPI:      [name] is a 24 y.o. G1P1001 whose LMP was No LMP recorded. (Menstrual status: IUD)., presents today for ***  Treated for BV with flagyl 5/21. Also has hx of eyast vag.  Past Medical History:  Diagnosis Date  . Anxiety   . Closed nondisplaced fracture of medial malleolus of left tibia 04/20/2017  . Depression   . Menorrhagia   . Orthostatic hypotension   . Sprain of left ankle 04/23/2017    Past Surgical History:  Procedure Laterality Date  . TONSILLECTOMY AND ADENOIDECTOMY  05/2013   at age 22  . WISDOM TOOTH EXTRACTION  02/2020    Family History  Problem Relation Age of Onset  . Hypertension Father   . Stroke Father   . Cancer Maternal Grandfather 9       kidney  . Hypertension Maternal Grandfather   . Ovarian cancer Paternal Grandmother   . Hypertension Mother   . Diabetes Paternal Grandfather   . Cancer Other 65       BREAST  . Endometriosis Other   . Ovarian cancer Other   . Cervical cancer Other     Social History   Socioeconomic History  . Marital status: Single    Spouse name: Not on file  . Number of children: 1  . Years of education: Not on file  . Highest education level: Not on file  Occupational History  . Occupation: RETAIL Investment banker, corporate: [id number]  Tobacco Use  . Smoking status: Former Smoker    Packs/day: 1.00    Years: 10.00    Pack years: 10.00    Types: Cigarettes  . Smokeless tobacco: Never Used  . Tobacco comment: Quit 2019; Started at age 59  Vaping Use  . Vaping Use: Former  . Quit date: 07/07/2018  . Substances: CBD  Substance and Sexual Activity  . Alcohol use: No    Comment: 3-4 mixed drinks per week prior to pregnancy  . Drug use: No  . Sexual activity: Yes    Birth control/protection: I.U.D.    Comment: Mirena  Other Topics Concern  . Not on file  Social History Narrative   Has 3yo daughter; lives with her mother and stepfather and  her daughter.   Social Determinants of Health   Financial Resource Strain:   . Difficulty of Paying Living Expenses:   Food Insecurity:   . Worried About Programme researcher, broadcasting/film/video in the Last Year:   . Barista in the Last Year:   Transportation Needs:   . Freight forwarder (Medical):   Marland Kitchen Lack of Transportation (Non-Medical):   Physical Activity:   . Days of Exercise per Week:   . Minutes of Exercise per Session:   Stress:   . Feeling of Stress :   Social Connections:   . Frequency of Communication with Friends and Family:   . Frequency of Social Gatherings with Friends and Family:   . Attends Religious Services:   . Active Member of Clubs or Organizations:   . Attends Banker Meetings:   Marland Kitchen Marital Status:   Intimate Partner Violence:   . Fear of Current or Ex-Partner:   . Emotionally Abused:   Marland Kitchen Physically Abused:   . Sexually Abused:     Outpatient Medications Prior to Visit  Medication Sig Dispense Refill  . levonorgestrel (MIRENA)  20 MCG/24HR IUD 1 each by Intrauterine route once.     No facility-administered medications prior to visit.      ROS:  Review of Systems BREAST: No symptoms   OBJECTIVE:   Vitals:  There were no vitals taken for this visit.  Physical Exam  Results: No results found for this or any previous visit (from the past 24 hour(s)).   Assessment/Plan: No diagnosis found.    No orders of the defined types were placed in this encounter.     No follow-ups on file.  Deanna Wiater B. Genever Hentges, PA-C 06/21/2020 2:27 PM

## 2020-06-22 ENCOUNTER — Ambulatory Visit: Payer: Managed Care, Other (non HMO) | Admitting: Obstetrics and Gynecology

## 2020-07-14 ENCOUNTER — Other Ambulatory Visit: Payer: Self-pay

## 2020-07-14 ENCOUNTER — Encounter: Payer: Self-pay | Admitting: Obstetrics and Gynecology

## 2020-07-14 ENCOUNTER — Other Ambulatory Visit (HOSPITAL_COMMUNITY)
Admission: RE | Admit: 2020-07-14 | Discharge: 2020-07-14 | Disposition: A | Discharge status: Home / Self Care | Payer: BC Managed Care – PPO | Source: Ambulatory Visit | Attending: Obstetrics and Gynecology | Admitting: Obstetrics and Gynecology

## 2020-07-14 ENCOUNTER — Ambulatory Visit (INDEPENDENT_AMBULATORY_CARE_PROVIDER_SITE_OTHER): Payer: BC Managed Care – PPO | Admitting: Obstetrics and Gynecology

## 2020-07-14 VITALS — BP 124/74 | HR 86 | Wt 139.0 lb

## 2020-07-14 DIAGNOSIS — Z3480 Encounter for supervision of other normal pregnancy, unspecified trimester: Secondary | ICD-10-CM | POA: Diagnosis not present

## 2020-07-14 DIAGNOSIS — Z3481 Encounter for supervision of other normal pregnancy, first trimester: Secondary | ICD-10-CM | POA: Diagnosis not present

## 2020-07-14 DIAGNOSIS — N912 Amenorrhea, unspecified: Secondary | ICD-10-CM

## 2020-07-14 DIAGNOSIS — Z3A01 Less than 8 weeks gestation of pregnancy: Secondary | ICD-10-CM | POA: Diagnosis not present

## 2020-07-14 HISTORY — DX: Encounter for supervision of other normal pregnancy, unspecified trimester: Z34.80

## 2020-07-14 LAB — POCT URINE PREGNANCY: Preg Test, Ur: POSITIVE — AB

## 2020-07-14 NOTE — Progress Notes (Signed)
New Obstetric Patient H&P   Chief Complaint: "Desires prenatal care"  History of Present Illness: Patient is a 24 y.o. G2P1001 Not Hispanic or Latino female, unsure LMP 05/20/2020 presents with amenorrhea and positive home pregnancy test. Based on her  LMP, her EDD is Estimated Date of Delivery: 02/24/2021 and her EGA is [redacted]w[redacted]d. She is unsure about her periods as she has only had her Mirena IUD since May. She thinks they are more or less regular.  Her last pap smear was almost 3 years ago and was normal.     She had a urine pregnancy test which was positive 3 or 4 week(s)  ago. Since her LMP she claims she has experienced some cramping. She also has had panic attacks since April of this year. She denies vaginal bleeding. Her past medical history is notable for anxiety (panic attacks). Her prior pregnancies are notable for some elevated blood pressure without a diagnosis of preeclampsia.  Since her LMP, she admits to the use of tobacco products: vapes on and off throughout the day She claims she has zero pounds since the start of her pregnancy.  There are cats in the home in the home  yes If yes they are indoor. She doesn't change the litter box.  She admits close contact with children on a regular basis  yes  She has had chicken pox in the past no She has had Tuberculosis exposures, symptoms, or previously tested positive for TB   no Current or past history of domestic violence. no  Genetic Screening/Teratology Counseling: (Includes patient, baby's father, or anyone in either family with:)   1. Patient's age >/= 82 at [facility]  no 2. Thalassemia ([place], [place], Mediterranean, or Asian background): MCV<80  no 3. Neural tube defect (meningomyelocele, spina bifida, anencephaly)  no 4. Congenital heart defect  no  5. Down syndrome  no 6. Tay-Sachs (Jewish, [place]  no 7. Canavan's Disease  no 8. Sickle cell disease or trait (African)  no  9. Hemophilia or other blood disorders  no  10.  Muscular dystrophy  no  11. Cystic fibrosis  no  12. Huntington's Chorea  no  13. Mental retardation/autism  no 14. Other inherited genetic or chromosomal disorder  no 15. Maternal metabolic disorder (DM, PKU, etc)  no 16. Patient or FOB with a child with a birth defect not listed above no  16a. Patient or FOB with a birth defect themselves no 17. Recurrent pregnancy loss, or stillbirth  no  18. Any medications since LMP other than prenatal vitamins (include vitamins, supplements, OTC meds, drugs, alcohol)  Takes cetirizine for allergies.  19. Any other genetic/environmental exposure to discuss  no  Infection History:   1. Lives with someone with TB or TB exposed  no  2. Patient or partner has history of genital herpes  no 3. Rash or viral illness since LMP  no 4. History of STI (GC, CT, HPV, syphilis, HIV)  H/o chlamydia with G1 5. History of recent travel :  no  Other pertinent information:  no     Review of Systems:10 point review of systems negative unless otherwise noted in HPI  Past Medical History:  Diagnosis Date  . Anxiety   . Closed nondisplaced fracture of medial malleolus of left tibia 04/20/2017  . Depression   . Menorrhagia   . Orthostatic hypotension   . Sprain of left ankle 04/23/2017    Past Surgical History:  Procedure Laterality Date  . TONSILLECTOMY AND ADENOIDECTOMY  05/2013   at age 66  . WISDOM TOOTH EXTRACTION  02/2020    Gynecologic History: Patient's last menstrual period was 05/20/2020.  Obstetric History: G2P1001  Family History  Problem Relation Age of Onset  . Hypertension Father   . Stroke Father   . Cancer Maternal Grandfather 41       kidney  . Hypertension Maternal Grandfather   . Ovarian cancer Paternal Grandmother   . Hypertension Mother   . Diabetes Paternal Grandfather   . Cancer Other 65       BREAST  . Endometriosis Other   . Ovarian cancer Other   . Cervical cancer Other     Social History   Socioeconomic History   . Marital status: Single    Spouse name: Not on file  . Number of children: 1  . Years of education: Not on file  . Highest education level: Not on file  Occupational History  . Occupation: RETAIL Investment banker, corporate: [id number]  Tobacco Use  . Smoking status: Former Smoker    Packs/day: 1.00    Years: 10.00    Pack years: 10.00    Types: Cigarettes  . Smokeless tobacco: Never Used  . Tobacco comment: Quit 2019; Started at age 79  Vaping Use  . Vaping Use: Every day  . Last attempt to quit: 07/07/2018  . Substances: CBD  Substance and Sexual Activity  . Alcohol use: No    Comment: 3-4 mixed drinks per week prior to pregnancy  . Drug use: No  . Sexual activity: Yes    Birth control/protection: None  Other Topics Concern  . Not on file  Social History Narrative   Has 3yo daughter; lives with her mother and stepfather and her daughter.   Social Determinants of Health   Financial Resource Strain:   . Difficulty of Paying Living Expenses: Not on file  Food Insecurity:   . Worried About Programme researcher, broadcasting/film/video in the Last Year: Not on file  . Ran Out of Food in the Last Year: Not on file  Transportation Needs:   . Lack of Transportation (Medical): Not on file  . Lack of Transportation (Non-Medical): Not on file  Physical Activity:   . Days of Exercise per Week: Not on file  . Minutes of Exercise per Session: Not on file  Stress:   . Feeling of Stress : Not on file  Social Connections:   . Frequency of Communication with Friends and Family: Not on file  . Frequency of Social Gatherings with Friends and Family: Not on file  . Attends Religious Services: Not on file  . Active Member of Clubs or Organizations: Not on file  . Attends Banker Meetings: Not on file  . Marital Status: Not on file  Intimate Partner Violence:   . Fear of Current or Ex-Partner: Not on file  . Emotionally Abused: Not on file  . Physically Abused: Not on file  . Sexually Abused: Not on file    Allergies: No Known Allergies  Prior to Admission medications: PNVs    Physical Exam BP 124/74   Pulse 86   Wt 139 lb (63 kg)   LMP 05/20/2020   BMI 24.62 kg/m   Physical Exam Constitutional:      General: She is not in acute distress.    Appearance: Normal appearance. She is well-developed.  Genitourinary:     Pelvic exam was performed with patient in the lithotomy position.  Vulva, inguinal canal, urethra, bladder, vagina, uterus, right adnexa and left adnexa normal.     No posterior fourchette tenderness, injury or lesion present.     No cervical friability, lesion, bleeding or polyp.  HENT:     Head: Normocephalic and atraumatic.  Eyes:     General: No scleral icterus.    Conjunctiva/sclera: Conjunctivae normal.  Cardiovascular:     Rate and Rhythm: Normal rate and regular rhythm.     Heart sounds: No murmur heard.  No friction rub. No gallop.   Pulmonary:     Effort: Pulmonary effort is normal. No respiratory distress.     Breath sounds: Normal breath sounds. No wheezing or rales.  Abdominal:     General: Bowel sounds are normal. There is no distension.     Palpations: Abdomen is soft. There is no mass.     Tenderness: There is no abdominal tenderness. There is no guarding or rebound.  Musculoskeletal:        General: Normal range of motion.     Cervical back: Normal range of motion and neck supple.  Neurological:     General: No focal deficit present.     Mental Status: She is alert and oriented to person, place, and time.     Cranial Nerves: No cranial nerve deficit.  Skin:    General: Skin is warm and dry.     Findings: No erythema.  Psychiatric:        Mood and Affect: Mood normal.        Behavior: Behavior normal.        Judgment: Judgment normal.    Female Chaperone present during breast and/or pelvic exam.   Assessment: 24 y.o. G2P1001 at [redacted]w[redacted]d presenting to initiate prenatal care  Plan: 1) Avoid alcoholic beverages. 2) Patient encouraged  not to smoke.  3) Discontinue the use of all non-medicinal drugs and chemicals.  4) Take prenatal vitamins daily.  5) Nutrition, food safety (fish, cheese advisories, and high nitrite foods) and exercise discussed. 6) Hospital and practice style discussed with cross coverage system.  7) Genetic Screening, such as with 1st Trimester Screening, cell free fetal DNA, AFP testing, and Ultrasound, as well as with amniocentesis and CVS as appropriate, is discussed with patient. At the conclusion of today's visit patient declined genetic testing 8) Patient is asked about travel to areas at risk for the [place] virus, and counseled to avoid travel and exposure to mosquitoes or sexual partners who may have themselves been exposed to the virus. Testing is discussed, and will be ordered as appropriate.   Thomasene Mohair, MD 07/14/2020 11:24 AM

## 2020-07-14 NOTE — Patient Instructions (Signed)
For nausea (these may be purchased over-the-counter): -Vitamin B6 (pyridoxine):  25 mg three times each day (may buy 100 mg tablet and take twice per day or try to cut into 4 equal pieces and take 1 piece three times each day).  - doxylamine (found in [employer] and other sleep agents that can be bought in the store): take 25 - 50 mg at bedtime.  May take up to 25 mg three time each day.  However, keep in mind that this might make you sleepy.  

## 2020-07-14 NOTE — Progress Notes (Signed)
NOB Vaginal discharge w/odor.

## 2020-07-15 LAB — URINE DRUG PANEL 7
Amphetamines, Urine: NEGATIVE ng/mL
Barbiturate Quant, Ur: NEGATIVE ng/mL
Benzodiazepine Quant, Ur: NEGATIVE ng/mL
Cannabinoid Quant, Ur: NEGATIVE ng/mL
Cocaine (Metab.): NEGATIVE ng/mL
Opiate Quant, Ur: NEGATIVE ng/mL
PCP Quant, Ur: NEGATIVE ng/mL

## 2020-07-15 LAB — CYTOLOGY - PAP
Chlamydia: NEGATIVE
Comment: NEGATIVE
Comment: NEGATIVE
Comment: NORMAL
Diagnosis: NEGATIVE
Neisseria Gonorrhea: NEGATIVE
Trichomonas: NEGATIVE

## 2020-07-15 LAB — RPR+RH+ABO+RUB AB+AB SCR+CB...
Antibody Screen: NEGATIVE
HIV Screen 4th Generation wRfx: NONREACTIVE
Hematocrit: 35.6 % (ref 34.0–46.6)
Hemoglobin: 11.9 g/dL (ref 11.1–15.9)
Hepatitis B Surface Ag: NEGATIVE
MCH: 30.9 pg (ref 26.6–33.0)
MCHC: 33.4 g/dL (ref 31.5–35.7)
MCV: 93 fL (ref 79–97)
Platelets: 254 10*3/uL (ref 150–450)
RBC: 3.85 x10E6/uL (ref 3.77–5.28)
RDW: 12.1 % (ref 11.7–15.4)
RPR Ser Ql: NONREACTIVE
Rh Factor: POSITIVE
Rubella Antibodies, IGG: 1.28 index (ref >0.99)
Varicella zoster IgG: 199 index (ref >165)
WBC: 6.5 10*3/uL (ref 3.4–10.8)

## 2020-07-16 LAB — URINE CULTURE

## 2020-07-23 ENCOUNTER — Ambulatory Visit (INDEPENDENT_AMBULATORY_CARE_PROVIDER_SITE_OTHER): Payer: BC Managed Care – PPO

## 2020-07-23 ENCOUNTER — Other Ambulatory Visit: Payer: Self-pay | Admitting: Obstetrics and Gynecology

## 2020-07-23 ENCOUNTER — Other Ambulatory Visit: Payer: Self-pay

## 2020-07-23 ENCOUNTER — Ambulatory Visit (INDEPENDENT_AMBULATORY_CARE_PROVIDER_SITE_OTHER): Payer: BC Managed Care – PPO | Admitting: Obstetrics and Gynecology

## 2020-07-23 ENCOUNTER — Encounter: Payer: Self-pay | Admitting: Obstetrics and Gynecology

## 2020-07-23 VITALS — BP 110/60 | Wt 136.0 lb

## 2020-07-23 DIAGNOSIS — Z3A09 9 weeks gestation of pregnancy: Secondary | ICD-10-CM | POA: Diagnosis not present

## 2020-07-23 DIAGNOSIS — Z3A01 Less than 8 weeks gestation of pregnancy: Secondary | ICD-10-CM | POA: Diagnosis not present

## 2020-07-23 DIAGNOSIS — Z3480 Encounter for supervision of other normal pregnancy, unspecified trimester: Secondary | ICD-10-CM | POA: Diagnosis not present

## 2020-07-23 LAB — POCT URINALYSIS DIPSTICK OB
Glucose, UA: NEGATIVE
POC,PROTEIN,UA: NEGATIVE

## 2020-07-23 NOTE — Progress Notes (Signed)
Routine Prenatal Care Visit  Subjective  Mandy Reeves is a 24 y.o. G2P1001 at [redacted]w[redacted]d being seen today for ongoing prenatal care.  She is currently monitored for the following issues for this low-risk pregnancy and has Current moderate episode of major depressive disorder without prior episode (HCC); GAD (generalized anxiety disorder); ADHD (attention deficit hyperactivity disorder), predominantly hyperactive impulsive type; Easy bruising; Cold intolerance; Bacterial vaginosis; and Supervision of other normal pregnancy, antepartum on their problem list.  ----------------------------------------------------------------------------------- Patient reports no complaints.    . Vag. Bleeding: None.   . Leaking Fluid denies.  U/S changed EDD due to uncertain LMP ----------------------------------------------------------------------------------- The following portions of the patient's history were reviewed and updated as appropriate: allergies, current medications, past family history, past medical history, past social history, past surgical history and problem list. Problem list updated.  Objective  Blood pressure 110/60, weight 136 lb (61.7 kg), last menstrual period 05/20/2020. Pregravid weight 136 lb (61.7 kg) Total Weight Gain 0 lb (0 kg) Urinalysis: Urine Protein Negative  Urine Glucose Negative  Fetal Status: Fetal Heart Rate (bpm): present ([place])         General:  Alert, oriented and cooperative. Patient is in no acute distress.  Skin: Skin is warm and dry. No rash noted.   Cardiovascular: Normal heart rate noted  Respiratory: Normal respiratory effort, no problems with respiration noted  Abdomen: Soft, gravid, appropriate for gestational age.       Pelvic:  Cervical exam deferred        Extremities: Normal range of motion.     Mental Status: Normal mood and affect. Normal behavior. Normal judgment and thought content.   Imaging Results US OB Transvaginal  Result Date:  07/23/2020 Patient Name: Mandy Reeves DOB: [birth year] MRN: [id number] ULTRASOUND REPORT Location: [facility] Date of Service: 07/23/2020 Indications:dating/viability Findings: Mason Jim intrauterine pregnancy is visualized with a CRL consistent with [redacted]w[redacted]d gestation, giving an (U/S) EDD of 02/21/2021. The (U/S) EDD is consistent with the clinically established EDD of 02/24/2021. FHR: 173 BPM CRL measurement: 27.2 mm Yolk sac is visualized and appears normal. Amnion: visualized and appears normal Right Ovary is normal in appearance. Left Ovary is normal appearance. Corpus luteal cyst:  Left ovary Survey of the adnexa demonstrates no adnexal masses. There is no free peritoneal fluid in the cul de sac. Impression: 1. [redacted]w[redacted]d Viable Singleton Intrauterine pregnancy by U/S. 2. (U/S) EDD is consistent with Clinically established EDD of 02/24/2021. Deanna Artis, RT There is a viable singleton gestation.  Detailed evaluation of the fetal anatomy is precluded by early gestational age.  It must be noted that a normal ultrasound particular at this early gestational age is unable to rule out fetal aneuploidy, risk of first trimester miscarriage, or anatomic birth defects. Thomasene Mohair, MD, [facility], [facility] 07/23/2020 9:46 AM      Assessment   24 y.o. G2P1001 at [redacted]w[redacted]d by  02/21/2021, by Ultrasound presenting for routine prenatal visit  Plan   Pregnancy#2 Problems (from 05/20/20 to present)    Problem Noted Resolved   Supervision of other normal pregnancy, antepartum 07/14/2020 by Conard Novak, MD No   Overview Signed 07/23/2020 10:14 AM by Conard Novak, MD    Clinic [facility] Prenatal Labs  Dating 9 wk u/s Blood type: O/Positive/-- (09/08 1204)   Genetic Screen 1 Screen:    AFP:     Quad:     NIPS: Antibody:Negative (09/08 1204)  Anatomic [place]  Rubella: 1.28 (09/08 1204)  Varicella: @VZVIGG @  GTT Early:               Third trimester:  RPR: Non Reactive (09/08 1204)    Rhogam  HBsAg: Negative (09/08 1204)   TDaP vaccine                       Flu Shot: HIV: Non Reactive (09/08 1204)   Baby Food                                GBS:   Contraception  Pap:  CBB     CS/VBAC    Support Person              Preterm labor symptoms and general obstetric precautions including but not limited to vaginal bleeding, contractions, leaking of fluid and fetal movement were reviewed in detail with the patient. Please refer to After Visit Summary for other counseling recommendations.   Return in about 4 weeks (around 08/20/2020) for Routine Prenatal Appointment.  08/22/2020, MD, [facility], [facility] 07/23/2020 10:14 AM

## 2020-08-06 ENCOUNTER — Telehealth: Payer: Self-pay

## 2020-08-06 NOTE — Telephone Encounter (Signed)
Pt calling; 81m; had ctx like cramp last night; doesn't feel like herself today.  [phone]  Adv cramping is normal throughout the preg as long as it doesn't stop her in her tracks or double her over.  Asked if maybe she has over done it physically.  Pt states 'possibly'.  She has a physical job.  Adv to try not to over do it; stay hydrated, eat right, and try to get more rest.

## 2020-08-09 ENCOUNTER — Ambulatory Visit (INDEPENDENT_AMBULATORY_CARE_PROVIDER_SITE_OTHER): Payer: BC Managed Care – PPO | Admitting: Obstetrics

## 2020-08-09 ENCOUNTER — Other Ambulatory Visit: Payer: Self-pay

## 2020-08-09 VITALS — BP 110/70 | Wt 140.0 lb

## 2020-08-09 DIAGNOSIS — B9689 Other specified bacterial agents as the cause of diseases classified elsewhere: Secondary | ICD-10-CM

## 2020-08-09 DIAGNOSIS — Z3A12 12 weeks gestation of pregnancy: Secondary | ICD-10-CM

## 2020-08-09 DIAGNOSIS — N898 Other specified noninflammatory disorders of vagina: Secondary | ICD-10-CM

## 2020-08-09 DIAGNOSIS — Z3480 Encounter for supervision of other normal pregnancy, unspecified trimester: Secondary | ICD-10-CM

## 2020-08-09 DIAGNOSIS — F411 Generalized anxiety disorder: Secondary | ICD-10-CM

## 2020-08-09 DIAGNOSIS — N760 Acute vaginitis: Secondary | ICD-10-CM

## 2020-08-09 LAB — POCT URINALYSIS DIPSTICK OB
Glucose, UA: NEGATIVE
POC,PROTEIN,UA: NEGATIVE

## 2020-08-09 MED ORDER — ESCITALOPRAM OXALATE 10 MG PO TABS: 10.0000 mg | ORAL_TABLET | Freq: Every day | ORAL | 3 refills | Status: DC

## 2020-08-09 MED ORDER — METRONIDAZOLE 0.75 % VA GEL: 1.0000 | Freq: Every day | VAGINAL | 1 refills | Status: AC

## 2020-08-09 NOTE — Progress Notes (Signed)
C/o cramping all weekend; bleeding started this am; no IC within 24hrs before bleeding started; spotting more than bleeding.rj

## 2020-08-09 NOTE — Progress Notes (Signed)
Routine Prenatal Care Visit  Subjective  Mandy Reeves is a 24 y.o. G2P1001 at [redacted]w[redacted]d being seen today for ongoing prenatal care.  She is currently monitored for the following issues for this low-risk pregnancy and has Current moderate episode of major depressive disorder without prior episode (HCC); GAD (generalized anxiety disorder); ADHD (attention deficit hyperactivity disorder), predominantly hyperactive impulsive type; Easy bruising; Cold intolerance; Bacterial vaginosis; and Supervision of other normal pregnancy, antepartum on their problem list.  ----------------------------------------------------------------------------------- Patient reports bleeding.  This started this morning, and she saw pink tinged vaginal discharge. Some cramping. Work in appt this morning. No recent IC. Also reports lengthy hx of anxiety  And some mild depression. Not on medication. Daily anxiety, including panic attacks. Has not seen a Mental Health counselor. Contractions: Not present. Vag. Bleeding: None.   . Leaking Fluid denies.  ----------------------------------------------------------------------------------- The following portions of the patient's history were reviewed and updated as appropriate: allergies, current medications, past family history, past medical history, past social history, past surgical history and problem list. Problem list updated.  Objective  Blood pressure 110/70, weight 140 lb (63.5 kg), last menstrual period 05/20/2020. Pregravid weight 136 lb (61.7 kg) Total Weight Gain 4 lb (1.814 kg) Urinalysis: Urine Protein Negative  Urine Glucose Negative  Fetal Status: Fetal Heart Rate (bpm): 167         General:  Alert, oriented and cooperative. Patient is in no acute distress.  Skin: Skin is warm and dry. No rash noted.   Cardiovascular: Normal heart rate noted  Respiratory: Normal respiratory effort, no problems with respiration noted  Abdomen: Soft, gravid, appropriate for  gestational age. Pain/Pressure: Present     Pelvic:  Cervical exam deferred        Extremities: Normal range of motion.  Edema: None  Mental Status:   Normal mood and affect. Normal behavior. Normal judgment and thought content.   speculum exam shows not blood . Some watery discharge. Wet mount shows numerous clue cells, neg whiff, no hyphae or yeast buds. Assessment   24 y.o. G2P1001 at [redacted]w[redacted]d by  02/21/2021, by Ultrasound presenting for work-in prenatal visit  Plan   Pregnancy#2 Problems (from 05/20/20 to present)    Problem Noted Resolved   Supervision of other normal pregnancy, antepartum 07/14/2020 by Conard Novak, MD No   Overview Addendum 08/09/2020 10:31 AM by Mirna Mires, CNM    Clinic [facility] Prenatal Labs  Dating 9 wk u/s Blood type: O/Positive/-- (09/08 1204)   Genetic Screen declines Antibody:Negative (09/08 1204)  Anatomic [place]  Rubella: 1.28 (09/08 1204) Varicella: immune  GTT Early:               Third trimester:  RPR: Non Reactive (09/08 1204)   Rhogam  HBsAg: Negative (09/08 1204)   TDaP vaccine                       Flu Shot: HIV: Non Reactive (09/08 1204)   Baby Food                                GBS:   Contraception  Pap: NILM  CBB     CS/VBAC    Support Person            Previous Version       Term labor symptoms and general obstetric precautions including but not limited to vaginal bleeding, contractions, leaking of fluid  and fetal movement were reviewed in detail with the patient. Please refer to After Visit Summary for other counseling recommendations.   BV- will Rx Metrogel Additional time spent addressing her anxiety. She is likely GAD, and also has some depressive sxs. She denies any suicidal thoughts, but ruminates and has daily fears about her children's safety. Open to a trial of SSRI. Will start on Lexapro 10 mg Q HS. Also strongly suggested a psychiatry referral. Will discuss this at next appt. Reassured re the absence of any active  bleeding. Return for keep next appointment.Mirna Mires, CNM  08/09/2020 11:12 AM

## 2020-08-09 NOTE — Telephone Encounter (Signed)
Pt called triage line this morning stating she called on Friday and was having cramping. She states today when she wiped she had blood on toilet tissue, nothing in underwear. She states the pain is mainly on her lower left side. States she has been a little constipated more than usual.   Appointment made this morning with Eunice Blase.

## 2020-08-20 ENCOUNTER — Other Ambulatory Visit: Payer: Self-pay

## 2020-08-20 ENCOUNTER — Encounter: Payer: Self-pay | Admitting: Advanced Practice Midwife

## 2020-08-20 ENCOUNTER — Ambulatory Visit (INDEPENDENT_AMBULATORY_CARE_PROVIDER_SITE_OTHER): Payer: Managed Care, Other (non HMO) | Admitting: Advanced Practice Midwife

## 2020-08-20 VITALS — BP 120/80 | Wt 141.0 lb

## 2020-08-20 DIAGNOSIS — O99342 Other mental disorders complicating pregnancy, second trimester: Secondary | ICD-10-CM

## 2020-08-20 DIAGNOSIS — Z3480 Encounter for supervision of other normal pregnancy, unspecified trimester: Secondary | ICD-10-CM

## 2020-08-20 DIAGNOSIS — F419 Anxiety disorder, unspecified: Secondary | ICD-10-CM

## 2020-08-20 DIAGNOSIS — Z3A13 13 weeks gestation of pregnancy: Secondary | ICD-10-CM

## 2020-08-20 LAB — POCT URINALYSIS DIPSTICK OB
Glucose, UA: NEGATIVE
POC,PROTEIN,UA: NEGATIVE

## 2020-08-20 MED ORDER — HYDROXYZINE HCL 25 MG PO TABS: 25.0000 mg | ORAL_TABLET | Freq: Four times a day (QID) | ORAL | 2 refills | Status: DC | PRN

## 2020-08-20 NOTE — Addendum Note (Signed)
Addended by: Tresea Mall on: 08/20/2020 04:07 PM   Modules accepted: Orders

## 2020-08-20 NOTE — Progress Notes (Signed)
Routine Prenatal Care Visit  Subjective  Mandy Reeves Reeves is a 24 y.o. G2P1001 at [redacted]w[redacted]d being seen today for ongoing prenatal care.  She is currently monitored for the following issues for this low-risk pregnancy and has Current moderate episode of major depressive disorder without prior episode (HCC); GAD (generalized anxiety disorder); ADHD (attention deficit hyperactivity disorder), predominantly hyperactive impulsive type; Easy bruising; Cold intolerance; Bacterial vaginosis; Supervision of other normal pregnancy, antepartum; and Panic attack on their problem list.  ----------------------------------------------------------------------------------- Patient reports ongoing anxiety, depression and occasional panic attacks. She did not start taking Lexapro that was prescribed last week due to concerns it will hurt baby. She is accompanied by her stepmom who relates that Mandy Reeves Reeves worries all the time, doesn't engage socially, will not go out by herself, avoids driving for fear of panic attacks. We had a lengthy discussion of medication support- risks/benefits of anti-depressant, atarax for prn, non-medicinal coping techniques, therapist support, and the potential effects that constant maternal stress/anxiety can have on fetus. Patient agrees she needs help and will try to find previous therapist and she is agreeable to taking atarax as needed. She is still unsure if she will take lexapro. She does not like to take any medication while pregnant.   Contractions: Not present. Vag. Bleeding: None.   . Leaking Fluid denies.  ----------------------------------------------------------------------------------- The following portions of the patient's history were reviewed and updated as appropriate: allergies, current medications, past family history, past medical history, past social history, past surgical history and problem list. Problem list updated.  Objective  Blood pressure 120/80, weight 141 lb (64  kg), last menstrual period 05/20/2020. Pregravid weight 136 lb (61.7 kg) Total Weight Gain 5 lb (2.268 kg) Urinalysis: Urine Protein    Urine Glucose    Fetal Status: Fetal Heart Rate (bpm): 158         General:  Alert, oriented and cooperative. Patient is in no acute distress.  Skin: Skin is warm and dry. No rash noted.   Cardiovascular: Normal heart rate noted  Respiratory: Normal respiratory effort, no problems with respiration noted  Abdomen: Soft, gravid, appropriate for gestational age. Pain/Pressure: Absent     Pelvic:  Cervical exam deferred        Extremities: Normal range of motion.  Edema: None  Mental Status: Normal mood and affect. Normal behavior. Normal judgment and thought content.   Assessment   24 y.o. G2P1001 at [redacted]w[redacted]d by  02/21/2021, by Ultrasound presenting for routine prenatal visit  Plan   Pregnancy#2 Problems (from 05/20/20 to present)    Problem Noted Resolved   Supervision of other normal pregnancy, antepartum 07/14/2020 by Mandy Reeves Novak, MD No   Overview Addendum 08/09/2020 10:31 AM by Mandy Reeves Reeves, CNM    Clinic [facility] Prenatal Labs  Dating 9 wk u/s Blood type: O/Positive/-- (09/08 1204)   Genetic Screen declines Antibody:Negative (09/08 1204)  Anatomic [place]  Rubella: 1.28 (09/08 1204) Varicella: immune  GTT Early:               Third trimester:  RPR: Non Reactive (09/08 1204)   Rhogam  HBsAg: Negative (09/08 1204)   TDaP vaccine                       Flu Shot: HIV: Non Reactive (09/08 1204)   Baby Food  GBS:   Contraception  Pap: NILM  CBB     CS/VBAC    Support Person            Previous Version   GAD (generalized anxiety disorder) 01/27/2013 by Mandy Reeves Mann, MD No   Overview Signed 08/09/2020 11:11 AM by Mandy Reeves Reeves, CNM    Started on Lexapro trial 10/4  Lengthy reported hx of panic attacks.        Rx Atarax for anxiety Recommend therapist support Trial of Lexapro recommended Encouraged to  utilize non-medicinal coping techniques   Preterm labor symptoms and general obstetric precautions including but not limited to vaginal bleeding, contractions, leaking of fluid and fetal movement were reviewed in detail with the patient. Please refer to After Visit Summary for other counseling recommendations.   Return in about 2 weeks (around 09/03/2020) for rob.  Mandy Reeves Reeves, CNM 08/20/2020 10:52 AM

## 2020-08-20 NOTE — Patient Instructions (Signed)
Perinatal Anxiety When a woman feels excessive tension or worry (anxiety) during pregnancy or during the first 12 months after she gives birth, she has a condition called perinatal anxiety. Anxiety can interfere with work, school, relationships, and other everyday activities. If it is not managed properly, it can also cause problems in the mother and her baby.  If you are pregnant and you have symptoms of an anxiety disorder, it is important to talk with your health care provider. What are the causes? The exact cause of this condition is not known. Hormonal changes during and after pregnancy may play a role in causing perinatal anxiety. What increases the risk? You are more likely to develop this condition if:  You have a personal or family history of depression, anxiety, or mood disorders.  You experience a stressful life event during pregnancy, such as the death of a loved one.  You have a lot of regular life stress, such as being a single parent.  You have thyroid problems. What are the signs or symptoms? Perinatal anxiety can be different for everyone. It may include:  Panic attacks (panic disorder). These are intense episodes of fear or discomfort that may also cause sweating, nausea, shortness of breath, or fear of dying. They usually last 5-15 minutes.  Reliving an upsetting (traumatic) event through distressing thoughts, dreams, or flashbacks (post-traumatic stress disorder, or PTSD).  Excessive worry about multiple problems (generalized anxiety disorder).  Fear and stress about leaving certain people or loved ones (separation anxiety).  Performing repetitive tasks (compulsions) to relieve stress or worry (obsessive compulsive disorder, or OCD).  Fear of certain objects or situations (phobias).  Excessive worrying, such as a constant feeling that something bad is going to happen.  Inability to relax.  Difficulty concentrating.  Sleep problems.  Frequent nightmares or  disturbing thoughts. How is this diagnosed? This condition is diagnosed based on a physical exam and mental evaluation. In some cases, your health care provider may use an anxiety screening tool. These tools include a list of questions that can help a health care provider diagnose anxiety. Your health care provider may refer you to a mental health expert who specializes in anxiety. How is this treated? This condition may be treated with:  Medicines. Your health care provider will only give you medicines that have been proven safe for pregnancy and breastfeeding.  Talk therapy with a mental health professional to help change your patterns of thinking (cognitive behavioral therapy).  Mindfulness-based stress reduction.  Other relaxation therapies, such as deep breathing or guided muscle relaxation.  Support groups. Follow these instructions at home: Lifestyle  Do not use any products that contain nicotine or tobacco, such as cigarettes and e-cigarettes. If you need help quitting, ask your health care provider.  Do not use alcohol when you are pregnant. After your baby is born, limit alcohol intake to no more than 1 drink a day. One drink equals 12 oz of beer, 5 oz of wine, or 1 oz of hard liquor.  Consider joining a support group for new mothers. Ask your health care provider for recommendations.  Take good care of yourself. Make sure you: ? Get plenty of sleep. If you are having trouble sleeping, talk with your health care provider. ? Eat a healthy diet. This includes plenty of fruits and vegetables, whole grains, and lean proteins. ? Exercise regularly, as told by your health care provider. Ask your health care provider what exercises are safe for you. General instructions  Take over-the-counter  and prescription medicines only as told by your health care provider.  Talk with your partner or family members about your feelings during pregnancy. Share any concerns or fears that you may  have.  Ask for help with tasks or chores when you need it. Ask friends and family members to provide meals, watch your children, or help with cleaning.  Keep all follow-up visits as told by your health care provider. This is important. Contact a health care provider if:  You (or people close to you) notice that you have any symptoms of anxiety or depression.  You have anxiety and your symptoms get worse.  You experience side effects from medicines, such as nausea or sleep problems. Get help right away if:  You feel like hurting yourself, your baby, or someone else. If you ever feel like you may hurt yourself or others, or have thoughts about taking your own life, get help right away. You can go to your nearest emergency department or call:  Your local emergency services (911 in the [place]  A suicide crisis helpline, such as the National Suicide Prevention Lifeline at 61053968271-832-236-5179. This is open 24 hours a day. Summary  Perinatal anxiety is when a woman feels excessive tension or worry during pregnancy or during the first 12 months after she gives birth.  Perinatal anxiety may include panic attacks, post-traumatic stress disorder, separation anxiety, phobias, or generalized anxiety.  Perinatal anxiety can cause physical health problems in the mother and baby if not properly managed.  This condition is treated with medicines, talk therapy, stress reduction therapies, or a combination of two or more treatments.  Talk with your partner or family members about your concerns or fears. Do not be afraid to ask for help. This information is not intended to replace advice given to you by your health care provider. Make sure you discuss any questions you have with your health care provider. Document Revised: 10/26/2017 Document Reviewed: 12/20/2016 Elsevier Patient Education  2020 [employer] Second Trimester of Pregnancy The second trimester is from week 14 through week 27 (months 4  through 6). The second trimester is often a time when you feel your best. Your body has adjusted to being pregnant, and you begin to feel better physically. Usually, morning sickness has lessened or quit completely, you may have more energy, and you may have an increase in appetite. The second trimester is also a time when the fetus is growing rapidly. At the end of the sixth month, the fetus is about 9 inches long and weighs about 1 pounds. You will likely begin to feel the baby move (quickening) between 16 and 20 weeks of pregnancy. Body changes during your second trimester Your body continues to go through many changes during your second trimester. The changes vary from woman to woman.  Your weight will continue to increase. You will notice your lower abdomen bulging out.  You may begin to get stretch marks on your hips, abdomen, and breasts.  You may develop headaches that can be relieved by medicines. The medicines should be approved by your health care provider.  You may urinate more often because the fetus is pressing on your bladder.  You may develop or continue to have heartburn as a result of your pregnancy.  You may develop constipation because certain hormones are causing the muscles that push waste through your intestines to slow down.  You may develop hemorrhoids or swollen, bulging veins (varicose veins).  You may have back pain. This is  caused by: ? Weight gain. ? Pregnancy hormones that are relaxing the joints in your pelvis. ? A shift in weight and the muscles that support your balance.  Your breasts will continue to grow and they will continue to become tender.  Your gums may bleed and may be sensitive to brushing and flossing.  Dark spots or blotches (chloasma, mask of pregnancy) may develop on your face. This will likely fade after the baby is born.  A dark line from your belly button to the pubic area (linea nigra) may appear. This will likely fade after the baby  is born.  You may have changes in your hair. These can include thickening of your hair, rapid growth, and changes in texture. Some women also have hair loss during or after pregnancy, or hair that feels dry or thin. Your hair will most likely return to normal after your baby is born. What to expect at prenatal visits During a routine prenatal visit:  You will be weighed to make sure you and the fetus are growing normally.  Your blood pressure will be taken.  Your abdomen will be measured to track your baby's growth.  The fetal heartbeat will be listened to.  Any test results from the previous visit will be discussed. Your health care provider may ask you:  How you are feeling.  If you are feeling the baby move.  If you have had any abnormal symptoms, such as leaking fluid, bleeding, severe headaches, or abdominal cramping.  If you are using any tobacco products, including cigarettes, chewing tobacco, and electronic cigarettes.  If you have any questions. Other tests that may be performed during your second trimester include:  Blood tests that check for: ? Low iron levels (anemia). ? High blood sugar that affects pregnant women (gestational diabetes) between 42 and 28 weeks. ? Rh antibodies. This is to check for a protein on red blood cells (Rh factor).  Urine tests to check for infections, diabetes, or protein in the urine.  An ultrasound to confirm the proper growth and development of the baby.  An amniocentesis to check for possible genetic problems.  Fetal screens for spina bifida and Down syndrome.  HIV (human immunodeficiency virus) testing. Routine prenatal testing includes screening for HIV, unless you choose not to have this test. Follow these instructions at home: Medicines  Follow your health care provider's instructions regarding medicine use. Specific medicines may be either safe or unsafe to take during pregnancy.  Take a prenatal vitamin that contains at  least 600 micrograms (mcg) of folic acid.  If you develop constipation, try taking a stool softener if your health care provider approves. Eating and drinking   Eat a balanced diet that includes fresh fruits and vegetables, whole grains, good sources of protein such as meat, eggs, or tofu, and low-fat dairy. Your health care provider will help you determine the amount of weight gain that is right for you.  Avoid raw meat and uncooked cheese. These carry germs that can cause birth defects in the baby.  If you have low calcium intake from food, talk to your health care provider about whether you should take a daily calcium supplement.  Limit foods that are high in fat and processed sugars, such as fried and sweet foods.  To prevent constipation: ? Drink enough fluid to keep your urine clear or pale yellow. ? Eat foods that are high in fiber, such as fresh fruits and vegetables, whole grains, and beans. Activity  Exercise only  as directed by your health care provider. Most women can continue their usual exercise routine during pregnancy. Try to exercise for 30 minutes at least 5 days a week. Stop exercising if you experience uterine contractions.  Avoid heavy lifting, wear low heel shoes, and practice good posture.  A sexual relationship may be continued unless your health care provider directs you otherwise. Relieving pain and discomfort  Wear a good support bra to prevent discomfort from breast tenderness.  Take warm sitz baths to soothe any pain or discomfort caused by hemorrhoids. Use hemorrhoid cream if your health care provider approves.  Rest with your legs elevated if you have leg cramps or low back pain.  If you develop varicose veins, wear support hose. Elevate your feet for 15 minutes, 3-4 times a day. Limit salt in your diet. Prenatal Care  Write down your questions. Take them to your prenatal visits.  Keep all your prenatal visits as told by your health care provider.  This is important. Safety  Wear your seat belt at all times when driving.  Make a list of emergency phone numbers, including numbers for family, friends, the hospital, and police and fire departments. General instructions  Ask your health care provider for a referral to a local prenatal education class. Begin classes no later than the beginning of month 6 of your pregnancy.  Ask for help if you have counseling or nutritional needs during pregnancy. Your health care provider can offer advice or refer you to specialists for help with various needs.  Do not use hot tubs, steam rooms, or saunas.  Do not douche or use tampons or scented sanitary pads.  Do not cross your legs for long periods of time.  Avoid cat litter boxes and soil used by cats. These carry germs that can cause birth defects in the baby and possibly loss of the fetus by miscarriage or stillbirth.  Avoid all smoking, herbs, alcohol, and unprescribed drugs. Chemicals in these products can affect the formation and growth of the baby.  Do not use any products that contain nicotine or tobacco, such as cigarettes and e-cigarettes. If you need help quitting, ask your health care provider.  Visit your dentist if you have not gone yet during your pregnancy. Use a soft toothbrush to brush your teeth and be gentle when you floss. Contact a health care provider if:  You have dizziness.  You have mild pelvic cramps, pelvic pressure, or nagging pain in the abdominal area.  You have persistent nausea, vomiting, or diarrhea.  You have a bad smelling vaginal discharge.  You have pain when you urinate. Get help right away if:  You have a fever.  You are leaking fluid from your vagina.  You have spotting or bleeding from your vagina.  You have severe abdominal cramping or pain.  You have rapid weight gain or weight loss.  You have shortness of breath with chest pain.  You notice sudden or extreme swelling of your face,  hands, ankles, feet, or legs.  You have not felt your baby move in over an hour.  You have severe headaches that do not go away when you take medicine.  You have vision changes. Summary  The second trimester is from week 14 through week 27 (months 4 through 6). It is also a time when the fetus is growing rapidly.  Your body goes through many changes during pregnancy. The changes vary from woman to woman.  Avoid all smoking, herbs, alcohol, and unprescribed drugs.  These chemicals affect the formation and growth your baby.  Do not use any tobacco products, such as cigarettes, chewing tobacco, and e-cigarettes. If you need help quitting, ask your health care provider.  Contact your health care provider if you have any questions. Keep all prenatal visits as told by your health care provider. This is important. This information is not intended to replace advice given to you by your health care provider. Make sure you discuss any questions you have with your health care provider. Document Revised: 02/14/2019 Document Reviewed: 11/28/2016 Elsevier Patient Education  2020 [employer].

## 2020-08-25 ENCOUNTER — Telehealth: Payer: Self-pay

## 2020-08-25 NOTE — Telephone Encounter (Signed)
Pt aware dark means it old; nothing to worry about.  To let us know if red and like a period.

## 2020-08-25 NOTE — Telephone Encounter (Signed)
Pt calling; is 14wk; has a really dark d/c.  [phone]  [facility].

## 2020-09-03 ENCOUNTER — Ambulatory Visit (INDEPENDENT_AMBULATORY_CARE_PROVIDER_SITE_OTHER): Payer: Managed Care, Other (non HMO) | Admitting: Obstetrics

## 2020-09-03 ENCOUNTER — Other Ambulatory Visit: Payer: Self-pay

## 2020-09-03 VITALS — BP 100/60 | Wt 142.0 lb

## 2020-09-03 DIAGNOSIS — Z3480 Encounter for supervision of other normal pregnancy, unspecified trimester: Secondary | ICD-10-CM

## 2020-09-03 DIAGNOSIS — Z3A18 18 weeks gestation of pregnancy: Secondary | ICD-10-CM

## 2020-09-03 DIAGNOSIS — Z3A15 15 weeks gestation of pregnancy: Secondary | ICD-10-CM

## 2020-09-03 LAB — POCT URINALYSIS DIPSTICK OB
Glucose, UA: NEGATIVE
POC,PROTEIN,UA: NEGATIVE

## 2020-09-03 NOTE — Progress Notes (Signed)
Routine Prenatal Care Visit  Subjective  Mandy Reeves is a 24 y.o. G2P1001 at [redacted]w[redacted]d being seen today for ongoing prenatal care.  She is currently monitored for the following issues for this low-risk pregnancy and has Current moderate episode of major depressive disorder without prior episode (HCC); GAD (generalized anxiety disorder); ADHD (attention deficit hyperactivity disorder), predominantly hyperactive impulsive type; Easy bruising; Cold intolerance; Bacterial vaginosis; Supervision of other normal pregnancy, antepartum; and Panic attack on their problem list.  ----------------------------------------------------------------------------------- Patient reports no complaints and She does admit that she has not taken either th Lexapro or the Atarax for anxiety. She is anxious on a daily basis..    .  .   [name] Fluid denies.  ----------------------------------------------------------------------------------- The following portions of the patient's history were reviewed and updated as appropriate: allergies, current medications, past family history, past medical history, past social history, past surgical history and problem list. Problem list updated.  Objective  Blood pressure 100/60, weight 142 lb (64.4 kg), last menstrual period 05/20/2020. Pregravid weight 136 lb (61.7 kg) Total Weight Gain 6 lb (2.722 kg) Urinalysis: Urine Protein Negative  Urine Glucose Negative  Fetal Status:           General:  Alert, oriented and cooperative. Patient is in no acute distress.  Skin: Skin is warm and dry. No rash noted.   Cardiovascular: Normal heart rate noted  Respiratory: Normal respiratory effort, no problems with respiration noted  Abdomen: Soft, gravid, appropriate for gestational age.       Pelvic:  Cervical exam deferred        Extremities: Normal range of motion.     Mental Status: Normal mood and affect. Normal behavior. Normal judgment and thought content.   Assessment   24  y.o. G2P1001 at [redacted]w[redacted]d by  02/21/2021, by Ultrasound presenting for routine prenatal visit  Plan   Pregnancy#2 Problems (from 05/20/20 to present)    Problem Noted Resolved   Supervision of other normal pregnancy, antepartum 07/14/2020 by Conard Novak, MD No   Overview Addendum 08/09/2020 10:31 AM by Mirna Mires, CNM    [facility] Prenatal Labs  Dating 9 wk u/s Blood type: O/Positive/-- (09/08 1204)   Genetic Screen declines Antibody:Negative (09/08 1204)  Anatomic [place]  Rubella: 1.28 (09/08 1204) Varicella: immune  GTT Early:               Third trimester:  RPR: Non Reactive (09/08 1204)   Rhogam  HBsAg: Negative (09/08 1204)   TDaP vaccine                       Flu Shot: HIV: Non Reactive (09/08 1204)   Baby Food                                GBS:   Contraception  Pap: NILM  CBB     CS/VBAC    Support Person            Previous Version   GAD (generalized anxiety disorder) 01/27/2013 by Chauncey Mann, MD No   Overview Signed 08/09/2020 11:11 AM by Mirna Mires, CNM    Started on Lexapro trial 10/4  Lengthy reported hx of panic attacks.          Preterm labor symptoms and general obstetric precautions including but not limited to vaginal bleeding, contractions, leaking of fluid and fetal movement were reviewed  in detail with the patient. Please refer to After Visit Summary for other counseling recommendations.  Discussed her mood issues,a dncarefully reviewed the research on the benefits. Unknown risks. Ecnouarged her to consider a trial of lexapro.  She admits to a SA hx as well- discussed hr other delivery when she disassociated during the second stage- Will continue the dialogue PRN.  No follow-ups on file.  Mirna Mires, CNM  09/03/2020 4:31 PM

## 2020-09-03 NOTE — Progress Notes (Signed)
C/o back problems - works at [employer] - adv maybe a back brace until further along in preg then a belly band.rj

## 2020-09-17 DIAGNOSIS — Z03818 Encounter for observation for suspected exposure to other biological agents ruled out: Secondary | ICD-10-CM | POA: Diagnosis not present

## 2020-09-24 ENCOUNTER — Encounter: Payer: Managed Care, Other (non HMO) | Admitting: Obstetrics

## 2020-09-24 ENCOUNTER — Ambulatory Visit: Payer: Managed Care, Other (non HMO)

## 2020-09-24 DIAGNOSIS — Z3A18 18 weeks gestation of pregnancy: Secondary | ICD-10-CM

## 2020-09-24 DIAGNOSIS — Z3480 Encounter for supervision of other normal pregnancy, unspecified trimester: Secondary | ICD-10-CM

## 2020-10-11 ENCOUNTER — Ambulatory Visit (INDEPENDENT_AMBULATORY_CARE_PROVIDER_SITE_OTHER): Payer: No Typology Code available for payment source

## 2020-10-11 ENCOUNTER — Ambulatory Visit (INDEPENDENT_AMBULATORY_CARE_PROVIDER_SITE_OTHER): Payer: BC Managed Care – PPO | Admitting: Obstetrics

## 2020-10-11 ENCOUNTER — Other Ambulatory Visit: Payer: Self-pay | Admitting: Obstetrics

## 2020-10-11 ENCOUNTER — Other Ambulatory Visit: Payer: Managed Care, Other (non HMO)

## 2020-10-11 ENCOUNTER — Other Ambulatory Visit: Payer: Self-pay

## 2020-10-11 VITALS — BP 100/60 | Wt 148.0 lb

## 2020-10-11 DIAGNOSIS — Z3689 Encounter for other specified antenatal screening: Secondary | ICD-10-CM

## 2020-10-11 DIAGNOSIS — Z3A21 21 weeks gestation of pregnancy: Secondary | ICD-10-CM

## 2020-10-11 DIAGNOSIS — Z3A20 20 weeks gestation of pregnancy: Secondary | ICD-10-CM

## 2020-10-11 DIAGNOSIS — Z3480 Encounter for supervision of other normal pregnancy, unspecified trimester: Secondary | ICD-10-CM

## 2020-10-11 LAB — POCT URINALYSIS DIPSTICK OB
Glucose, UA: NEGATIVE
POC,PROTEIN,UA: NEGATIVE

## 2020-10-11 NOTE — Progress Notes (Signed)
Routine Prenatal Care Visit  Subjective  Mandy Reeves is a 24 y.o. G2P1001 at [redacted]w[redacted]d being seen today for ongoing prenatal care.  She is currently monitored for the following issues for this low-risk pregnancy and has Current moderate episode of major depressive disorder without prior episode (HCC); GAD (generalized anxiety disorder); ADHD (attention deficit hyperactivity disorder), predominantly hyperactive impulsive type; Easy bruising; Cold intolerance; Bacterial vaginosis; Supervision of other normal pregnancy, antepartum; and Panic attack on their problem list.  ----------------------------------------------------------------------------------- Patient reports no complaints.  jShe just had her anatomy scan. Having a girl! The right kidney has mild pelviectasis- will rescan in third trimester.  Contractions: Not present. Vag. Bleeding: None.  Movement: Present. Leaking Fluid denies.  ----------------------------------------------------------------------------------- The following portions of the patient's history were reviewed and updated as appropriate: allergies, current medications, past family history, past medical history, past social history, past surgical history and problem list. Problem list updated.  Objective  Blood pressure 100/60, weight 148 lb (67.1 kg), last menstrual period 05/20/2020. Pregravid weight 136 lb (61.7 kg) Total Weight Gain 12 lb (5.443 kg) Urinalysis: Urine Protein    Urine Glucose    Fetal Status:     Movement: Present     General:  Alert, oriented and cooperative. Patient is in no acute distress.  Skin: Skin is warm and dry. No rash noted.   Cardiovascular: Normal heart rate noted  Respiratory: Normal respiratory effort, no problems with respiration noted  Abdomen: Soft, gravid, appropriate for gestational age. Pain/Pressure: Present     Pelvic:  Cervical exam deferred        Extremities: Normal range of motion.     Mental Status: Normal mood and  affect. Normal behavior. Normal judgment and thought content.   Assessment   24 y.o. G2P1001 at [redacted]w[redacted]d by  02/21/2021, by Ultrasound presenting for routine prenatal visit  Plan   Pregnancy#2 Problems (from 05/20/20 to present)    Problem Noted Resolved   Supervision of other normal pregnancy, antepartum 07/14/2020 by Conard Novak, MD No   Overview Addendum 10/11/2020  4:22 PM by Mirna Mires, CNM    [facility] Prenatal Labs  Dating 9 wk u/s Blood type: O/Positive/-- (09/08 1204)   Genetic Screen declines Antibody:Negative (09/08 1204)  Anatomic [place] Normal anatomy Rubella: 1.28 (09/08 1204) Varicella: immune  GTT Early:               Third trimester:  RPR: Non Reactive (09/08 1204)   Rhogam  HBsAg: Negative (09/08 1204)   TDaP vaccine                       Flu Shot: HIV: Non Reactive (09/08 1204)   Baby Food                                GBS:   Contraception  Pap: NILM  CBB     CS/VBAC    Support Person            Previous Version   GAD (generalized anxiety disorder) 01/27/2013 by Chauncey Mann, MD No   Overview Signed 08/09/2020 11:11 AM by Mirna Mires, CNM    Started on Lexapro trial 10/4  Lengthy reported hx of panic attacks.          Preterm labor symptoms and general obstetric precautions including but not limited to vaginal bleeding, contractions, leaking of fluid and fetal movement  were reviewed in detail with the patient. Please refer to After Visit Summary for other counseling recommendations.   Return in about 4 weeks (around 11/08/2020) for return OB.  Mirna Mires, CNM  10/11/2020 4:23 PM

## 2020-10-11 NOTE — Addendum Note (Signed)
Addended by: Cornelius Moras D on: 10/11/2020 04:50 PM   Modules accepted: Orders

## 2020-10-20 ENCOUNTER — Other Ambulatory Visit: Payer: Self-pay

## 2020-10-20 ENCOUNTER — Telehealth: Payer: Self-pay

## 2020-10-20 ENCOUNTER — Ambulatory Visit (INDEPENDENT_AMBULATORY_CARE_PROVIDER_SITE_OTHER): Payer: BC Managed Care – PPO

## 2020-10-20 DIAGNOSIS — R1030 Lower abdominal pain, unspecified: Secondary | ICD-10-CM | POA: Diagnosis not present

## 2020-10-20 LAB — POCT URINALYSIS DIPSTICK
Appearance: NORMAL
Bilirubin, UA: NEGATIVE
Blood, UA: NEGATIVE
Glucose, UA: NEGATIVE
Ketones, UA: NEGATIVE
Leukocytes, UA: NEGATIVE
Nitrite, UA: NEGATIVE
Odor: NORMAL
Protein, UA: NEGATIVE
Spec Grav, UA: 1.01 (ref 1.010–1.025)
Urobilinogen, UA: 0.2 E.U./dL
pH, UA: 6.5 (ref 5.0–8.0)

## 2020-10-20 NOTE — Progress Notes (Signed)
Patient presents today with complaints of a strong urine odor, some cramping after urinating, no burning/pain with urinating. UA performed (NEG). Results recorded.

## 2020-10-20 NOTE — Telephone Encounter (Signed)
Pt left msg on triage saying over the weekend she picked up a stomach bug. When she goes to the bathroom theres a smell, along with her lower back hurting. Called her back to get more info, no answer, LVMTRC.

## 2020-10-20 NOTE — Telephone Encounter (Signed)
Pt called back, says she has a strong urine odor, some cramping after urinating, no burning/paind urinating. Denies vaginal bleeding or blood in urine, Says sx started this weekend. Advised to drop off urine to be checked for UTI, scheduled for 2pm this pm.

## 2020-10-21 NOTE — Telephone Encounter (Signed)
No, that's a very normal UA

## 2020-10-22 NOTE — Telephone Encounter (Signed)
Patient advised urinalysis negative. No culture sent. Please follow up with any further questions/concerns.

## 2020-11-06 NOTE — L&D Delivery Note (Signed)
Delivery Note At 1044 a viable female was delivered via vaginal birth (Presentation:  LOA, with restitution to LOT    ). Complete dilateion at 1036. The Patient pushed spontaneously  With rapid descent of the vertex in 5 minutes. The head presented LOA and then rotated to LOT. With gentle downward guidance, the anterior should was delivered, and then the posterior shoulder was delivered using gentle upward guidance.  Intact perineum noted. APGAR: 9,9 ; weight  .   Placenta status:  intact  .  Cord:  3 vessel with the following complications:  none.    Anesthesia:  none Episiotomy:  none Lacerations:  none Suture Repair: NA Est. Blood Loss (mL):  100ccs  Mom to postpartum.  Baby to Couplet care / Skin to Skin.  Mirna Mires 02/17/2021, 11:32 AM

## 2020-11-12 ENCOUNTER — Ambulatory Visit (INDEPENDENT_AMBULATORY_CARE_PROVIDER_SITE_OTHER): Payer: BC Managed Care – PPO | Admitting: Obstetrics and Gynecology

## 2020-11-12 ENCOUNTER — Other Ambulatory Visit: Payer: Self-pay

## 2020-11-12 ENCOUNTER — Encounter: Payer: Self-pay | Admitting: Obstetrics and Gynecology

## 2020-11-12 VITALS — BP 122/68 | HR 82 | Wt 155.0 lb

## 2020-11-12 DIAGNOSIS — Z3A25 25 weeks gestation of pregnancy: Secondary | ICD-10-CM

## 2020-11-12 DIAGNOSIS — Z131 Encounter for screening for diabetes mellitus: Secondary | ICD-10-CM

## 2020-11-12 DIAGNOSIS — Z3482 Encounter for supervision of other normal pregnancy, second trimester: Secondary | ICD-10-CM

## 2020-11-12 DIAGNOSIS — Z113 Encounter for screening for infections with a predominantly sexual mode of transmission: Secondary | ICD-10-CM

## 2020-11-12 LAB — POCT URINALYSIS DIPSTICK OB
Bilirubin, UA: NEGATIVE
Blood, UA: NEGATIVE
Glucose, UA: NEGATIVE
Ketones, UA: NEGATIVE
Nitrite, UA: NEGATIVE
POC,PROTEIN,UA: NEGATIVE
Spec Grav, UA: 1.015 (ref 1.010–1.025)
Urobilinogen, UA: 0.2 E.U./dL
pH, UA: 6.5 (ref 5.0–8.0)

## 2020-11-12 NOTE — Progress Notes (Signed)
Routine Prenatal Care Visit  Subjective  Mandy Reeves is a 25 y.o. G2P1001 at [redacted]w[redacted]d being seen today for ongoing prenatal care.  She is currently monitored for the following issues for this low-risk pregnancy and has Current moderate episode of major depressive disorder without prior episode (HCC); GAD (generalized anxiety disorder); ADHD (attention deficit hyperactivity disorder), predominantly hyperactive impulsive type; Easy bruising; Cold intolerance; Bacterial vaginosis; Supervision of other normal pregnancy, antepartum; and Panic attack on their problem list.  ----------------------------------------------------------------------------------- Patient reports no complaints.   Contractions: Not present. Vag. Bleeding: None.  Movement: Present. Leaking Fluid denies.  ----------------------------------------------------------------------------------- The following portions of the patient's history were reviewed and updated as appropriate: allergies, current medications, past family history, past medical history, past social history, past surgical history and problem list. Problem list updated.  Objective  Blood pressure 122/68, pulse 82, weight 155 lb (70.3 kg), last menstrual period 05/20/2020. Pregravid weight 136 lb (61.7 kg) Total Weight Gain 19 lb (8.618 kg) Urinalysis: Urine Protein Negative  Urine Glucose Negative  Fetal Status: Fetal Heart Rate (bpm): 150 Fundal Height: 35 cm Movement: Present     General:  Alert, oriented and cooperative. Patient is in no acute distress.  Skin: Skin is warm and dry. No rash noted.   Cardiovascular: Normal heart rate noted  Respiratory: Normal respiratory effort, no problems with respiration noted  Abdomen: Soft, gravid, appropriate for gestational age. Pain/Pressure: Present     Pelvic:  Cervical exam deferred        Extremities: Normal range of motion.  Edema: None  Mental Status: Normal mood and affect. Normal behavior. Normal judgment  and thought content.   Assessment   25 y.o. G2P1001 at [redacted]w[redacted]d by  02/21/2021, by Ultrasound presenting for routine prenatal visit  Plan   Pregnancy#2 Problems (from 05/20/20 to present)    Problem Noted Resolved   Supervision of other normal pregnancy, antepartum 07/14/2020 by Conard Novak, MD No   Overview Addendum 10/11/2020  4:22 PM by Mirna Mires, CNM    [facility]inic [facility] Prenatal Labs  Dating 9 wk u/s Blood type: O/Positive/-- (09/08 1204)   Genetic Screen declines Antibody:Negative (09/08 1204)  Anatomic [place] Normal anatomy Rubella: 1.28 (09/08 1204) Varicella: immune  GTT Early:               Third trimester:  RPR: Non Reactive (09/08 1204)   Rhogam  HBsAg: Negative (09/08 1204)   TDaP vaccine                       Flu Shot: HIV: Non Reactive (09/08 1204)   Baby Food                                GBS:   Contraception  Pap: NILM  CBB     CS/VBAC    Support Person            Previous Version   GAD (generalized anxiety disorder) 01/27/2013 by Chauncey Mann, MD No   Overview Signed 08/09/2020 11:11 AM by Mirna Mires, CNM    Started on Lexapro trial 10/4  Lengthy reported hx of panic attacks.          Preterm labor symptoms and general obstetric precautions including but not limited to vaginal bleeding, contractions, leaking of fluid and fetal movement were reviewed in detail with the patient. Please refer to After Visit Summary for other  counseling recommendations.   - ultrasound planned for 32 weeks for fetal renal pyelectasis   Return in about 2 weeks (around 11/26/2020) for 28 week labs and routine prenatal.   Thomasene Mohair, MD, [facility] 11/12/2020 2:20 PM

## 2020-11-26 ENCOUNTER — Ambulatory Visit (INDEPENDENT_AMBULATORY_CARE_PROVIDER_SITE_OTHER): Payer: BC Managed Care – PPO | Admitting: Obstetrics

## 2020-11-26 ENCOUNTER — Other Ambulatory Visit: Payer: BC Managed Care – PPO

## 2020-11-26 ENCOUNTER — Other Ambulatory Visit: Payer: Self-pay

## 2020-11-26 VITALS — BP 110/70 | Wt 155.0 lb

## 2020-11-26 DIAGNOSIS — Z3482 Encounter for supervision of other normal pregnancy, second trimester: Secondary | ICD-10-CM

## 2020-11-26 DIAGNOSIS — Z3480 Encounter for supervision of other normal pregnancy, unspecified trimester: Secondary | ICD-10-CM

## 2020-11-26 DIAGNOSIS — Z113 Encounter for screening for infections with a predominantly sexual mode of transmission: Secondary | ICD-10-CM

## 2020-11-26 DIAGNOSIS — Z3A27 27 weeks gestation of pregnancy: Secondary | ICD-10-CM

## 2020-11-26 DIAGNOSIS — Z131 Encounter for screening for diabetes mellitus: Secondary | ICD-10-CM

## 2020-11-26 LAB — POCT URINALYSIS DIPSTICK OB
Glucose, UA: NEGATIVE
POC,PROTEIN,UA: NEGATIVE
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
## 2020-11-26 NOTE — Addendum Note (Signed)
Addended by: Donnetta Hail on: 11/26/2020 10:32 AM   Modules accepted: Orders

## 2020-11-26 NOTE — Progress Notes (Signed)
ROB/1 hr GTT- no concerns 

## 2020-11-27 LAB — 28 WEEK RH+PANEL
Basophils Absolute: 0 10*3/uL (ref 0.0–0.2)
Basos: 0 %
EOS (ABSOLUTE): 0 10*3/uL (ref 0.0–0.4)
Eos: 1 %
Gestational Diabetes Screen: 98 mg/dL (ref 65–139)
HIV Screen 4th Generation wRfx: NONREACTIVE
Hematocrit: 33.1 % — ABNORMAL LOW (ref 34.0–46.6)
Hemoglobin: 11.5 g/dL (ref 11.1–15.9)
Immature Grans (Abs): 0 10*3/uL (ref 0.0–0.1)
Immature Granulocytes: 1 %
Lymphocytes Absolute: 1.1 10*3/uL (ref 0.7–3.1)
Lymphs: 15 %
MCH: 32 pg (ref 26.6–33.0)
MCHC: 34.7 g/dL (ref 31.5–35.7)
MCV: 92 fL (ref 79–97)
Monocytes Absolute: 0.3 10*3/uL (ref 0.1–0.9)
Monocytes: 4 %
Neutrophils Absolute: 5.8 10*3/uL (ref 1.4–7.0)
Neutrophils: 79 %
Platelets: 233 10*3/uL (ref 150–450)
RBC: 3.59 x10E6/uL — ABNORMAL LOW (ref 3.77–5.28)
RDW: 12 % (ref 11.7–15.4)
RPR Ser Ql: NONREACTIVE
WBC: 7.3 10*3/uL (ref 3.4–10.8)

## 2020-12-10 ENCOUNTER — Encounter: Payer: Self-pay | Admitting: Obstetrics and Gynecology

## 2020-12-10 ENCOUNTER — Other Ambulatory Visit: Payer: Self-pay

## 2020-12-10 ENCOUNTER — Ambulatory Visit (INDEPENDENT_AMBULATORY_CARE_PROVIDER_SITE_OTHER): Payer: BC Managed Care – PPO | Admitting: Obstetrics and Gynecology

## 2020-12-10 VITALS — BP 110/60 | Wt 158.0 lb

## 2020-12-10 DIAGNOSIS — N760 Acute vaginitis: Secondary | ICD-10-CM

## 2020-12-10 DIAGNOSIS — Z3480 Encounter for supervision of other normal pregnancy, unspecified trimester: Secondary | ICD-10-CM

## 2020-12-10 DIAGNOSIS — Z3A29 29 weeks gestation of pregnancy: Secondary | ICD-10-CM | POA: Diagnosis not present

## 2020-12-10 DIAGNOSIS — Z362 Encounter for other antenatal screening follow-up: Secondary | ICD-10-CM

## 2020-12-10 DIAGNOSIS — B9689 Other specified bacterial agents as the cause of diseases classified elsewhere: Secondary | ICD-10-CM

## 2020-12-10 DIAGNOSIS — Z3482 Encounter for supervision of other normal pregnancy, second trimester: Secondary | ICD-10-CM

## 2020-12-10 LAB — POCT URINALYSIS DIPSTICK OB
Glucose, UA: NEGATIVE
POC,PROTEIN,UA: NEGATIVE

## 2020-12-10 MED ORDER — METRONIDAZOLE 0.75 % VA GEL: 1.0000 | Freq: Every day | VAGINAL | 0 refills | Status: AC

## 2020-12-10 NOTE — Progress Notes (Signed)
Routine Prenatal Care Visit  Subjective  Mandy Reeves is a 25 y.o. G2P1001 at [redacted]w[redacted]d being seen today for ongoing prenatal care.  She is currently monitored for the following issues for this low-risk pregnancy and has Current moderate episode of major depressive disorder without prior episode (HCC); GAD (generalized anxiety disorder); ADHD (attention deficit hyperactivity disorder), predominantly hyperactive impulsive type; Easy bruising; Cold intolerance; Bacterial vaginosis; Supervision of other normal pregnancy, antepartum; and Panic attack on their problem list.  ----------------------------------------------------------------------------------- Patient reports intermittent green vaginal discharge. No odor associated with it..   Contractions: Irritability. Vag. Bleeding: None.  Movement: Present. Denies leaking of fluid.  ----------------------------------------------------------------------------------- The following portions of the patient's history were reviewed and updated as appropriate: allergies, current medications, past family history, past medical history, past social history, past surgical history and problem list. Problem list updated.   Objective  Blood pressure 110/60, weight 158 lb (71.7 kg), last menstrual period 05/20/2020. Pregravid weight 136 lb (61.7 kg) Total Weight Gain 22 lb (9.979 kg) Urinalysis:      Fetal Status: Fetal Heart Rate (bpm): 145 Fundal Height: 28 cm Movement: Present     General:  Alert, oriented and cooperative. Patient is in no acute distress.  Skin: Skin is warm and dry. No rash noted.   Cardiovascular: Normal heart rate noted  Respiratory: Normal respiratory effort, no problems with respiration noted  Abdomen: Soft, gravid, appropriate for gestational age. Pain/Pressure: Absent     Pelvic:  Cervical exam deferred        Extremities: Normal range of motion.     ental Status: Normal mood and affect. Normal behavior. Normal judgment and  thought content.   Wet Prep: PH: 5.0 Clue Cells: Positive Fungal elements: Negative Trichomonas: Negative   Assessment   25 y.o. G2P1001 at [redacted]w[redacted]d by  02/21/2021, by Ultrasound presenting for routine prenatal visit  Plan   Pregnancy#2 Problems (from 05/20/20 to present)    Problem Noted Resolved   Supervision of other normal pregnancy, antepartum 07/14/2020 by Conard Novak, MD No   Overview Addendum 12/10/2020  9:29 AM by [name], CNM    [facility]inic [facility] Prenatal Labs  Dating 9 wk u/s Blood type: O/Positive/-- (09/08 1204)   Genetic Screen declines Antibody:Negative (09/08 1204)  Anatomic [place]  R pelviectasis - f/u at 32 weeks Rubella: 1.28 (09/08 1204) Varicella: immune  GTT  Third trimester: 98 RPR: Non Reactive (09/08 1204)   Rhogam  n/a HBsAg: Negative (09/08 1204)   TDaP vaccine                     Flu Shot: HIV: Non Reactive (09/08 1204)   Baby Food                                GBS:   Contraception  Pap: NILM  CBB     CS/VBAC    Support Person            Previous Version   GAD (generalized anxiety disorder) 01/27/2013 by Chauncey Mann, MD No   Overview Signed 08/09/2020 11:11 AM by Mirna Mires, CNM    Started on Lexapro trial 10/4  Lengthy reported hx of panic attacks.         -Reviewed plan for anatomy f/u at next visit -Will obtain growth [place] at next visit -Wet mount + for clue cells - metrogel Rx'd -Tdap counseling today- patient  desires at next visit  Preterm precautions including but not limited to vaginal bleeding, contractions, leaking of fluid and fetal movement were reviewed in detail with the patient.    Return in about 2 weeks (around 12/24/2020) for ROB with [place] (growth and anatomy f/u).  [name], CNM, MSN [facility], [facility] 12/10/2020, 9:29 AM

## 2020-12-24 ENCOUNTER — Encounter: Payer: Self-pay | Admitting: Obstetrics and Gynecology

## 2020-12-24 ENCOUNTER — Encounter: Payer: BC Managed Care – PPO | Admitting: Obstetrics and Gynecology

## 2020-12-24 ENCOUNTER — Ambulatory Visit (INDEPENDENT_AMBULATORY_CARE_PROVIDER_SITE_OTHER): Payer: BC Managed Care – PPO | Admitting: Obstetrics and Gynecology

## 2020-12-24 ENCOUNTER — Ambulatory Visit: Payer: BC Managed Care – PPO

## 2020-12-24 VITALS — BP 110/60 | Wt 161.0 lb

## 2020-12-24 DIAGNOSIS — Z3A31 31 weeks gestation of pregnancy: Secondary | ICD-10-CM

## 2020-12-24 DIAGNOSIS — Z3482 Encounter for supervision of other normal pregnancy, second trimester: Secondary | ICD-10-CM

## 2020-12-24 DIAGNOSIS — Z362 Encounter for other antenatal screening follow-up: Secondary | ICD-10-CM

## 2020-12-24 NOTE — Progress Notes (Signed)
Routine Prenatal Care Visit  Subjective  Mandy Reeves is a 25 y.o. G2P1001 at 102w4d being seen today for ongoing prenatal care.  She is currently monitored for the following issues for this low-risk pregnancy and has Current moderate episode of major depressive disorder without prior episode (HCC); GAD (generalized anxiety disorder); ADHD (attention deficit hyperactivity disorder), predominantly hyperactive impulsive type; Easy bruising; Cold intolerance; Bacterial vaginosis; Supervision of other normal pregnancy, antepartum; and Panic attack on their problem list.  ----------------------------------------------------------------------------------- Patient reports significant anxiety today after [place] had to be rescheduled. Patient states there is a family history of kidney disease and she has been very stressed about the f/u for her anatomy scan. Otherwise, patient reports anxiety has been pretty well-controlled.   Contractions: Irritability. Vag. Bleeding: None.  Movement: Present. Denies leaking of fluid.  ----------------------------------------------------------------------------------- The following portions of the patient's history were reviewed and updated as appropriate: allergies, current medications, past family history, past medical history, past social history, past surgical history and problem list. Problem list updated.   Objective  Blood pressure 110/60, weight 161 lb (73 kg), last menstrual period 05/20/2020. Pregravid weight 136 lb (61.7 kg) Total Weight Gain 25 lb (11.3 kg) Urinalysis:      Fetal Status: Fetal Heart Rate (bpm): 140 Fundal Height: 31 cm Movement: Present     General:  Alert, oriented and cooperative. Patient is in no acute distress.  Skin: Skin is warm and dry. No rash noted.   Cardiovascular: Normal heart rate noted  Respiratory: Normal respiratory effort, no problems with respiration noted  Abdomen: Soft, gravid, appropriate for gestational age.  Pain/Pressure: Absent     Pelvic:  Cervical exam deferred        Extremities: Normal range of motion.  Edema: None  ental Status: Normal mood and affect. Normal behavior. Normal judgment and thought content.     Assessment   25 y.o. G2P1001 at [redacted]w[redacted]d by  02/21/2021, by Ultrasound presenting for routine prenatal visit  Plan   Pregnancy#2 Problems (from 05/20/20 to present)    Problem Noted Resolved   Supervision of other normal pregnancy, antepartum 07/14/2020 by Conard Novak, MD No   Overview Addendum 12/10/2020  9:29 AM by [name], CNM    [facility] Prenatal Labs  Dating 9 wk u/s Blood type: O/Positive/-- (09/08 1204)   Genetic Screen declines Antibody:Negative (09/08 1204)  Anatomic [place]  R pelviectasis - f/u at 32 weeks Rubella: 1.28 (09/08 1204) Varicella: immune  GTT  Third trimester: 98 RPR: Non Reactive (09/08 1204)   Rhogam  n/a HBsAg: Negative (09/08 1204)   TDaP vaccine                     Flu Shot: HIV: Non Reactive (09/08 1204)   Baby Food                                GBS:   Contraception  Pap: NILM  CBB     CS/VBAC    Support Person            Previous Version   GAD (generalized anxiety disorder) 01/27/2013 by Chauncey Mann, MD No   Overview Signed 08/09/2020 11:11 AM by Mirna Mires, CNM    Started on Lexapro trial 10/4  Lengthy reported hx of panic attacks.          -Unable to complete US today due to office  staffing limitations - patient rescheduled for next week -Discussed normal expectations at this gestational age for fetal movement -Patient declined Tdap today - will defer until next visit  Preterm precautions including but not limited to vaginal bleeding, contractions, leaking of fluid and fetal movement were reviewed in detail with the patient.    Keep visit as scheduled for next week.  [name], CNM, MSN [facility], [facility] 12/24/2020, 2:15 PM

## 2020-12-27 ENCOUNTER — Other Ambulatory Visit: Payer: Self-pay | Admitting: Obstetrics

## 2020-12-27 DIAGNOSIS — O358XX0 Maternal care for other (suspected) fetal abnormality and damage, not applicable or unspecified: Secondary | ICD-10-CM

## 2020-12-27 DIAGNOSIS — N2889 Other specified disorders of kidney and ureter: Secondary | ICD-10-CM

## 2020-12-27 DIAGNOSIS — O35EXX0 Maternal care for other (suspected) fetal abnormality and damage, fetal genitourinary anomalies, not applicable or unspecified: Secondary | ICD-10-CM

## 2020-12-27 DIAGNOSIS — Z3482 Encounter for supervision of other normal pregnancy, second trimester: Secondary | ICD-10-CM

## 2020-12-28 ENCOUNTER — Encounter: Payer: Self-pay | Admitting: Obstetrics and Gynecology

## 2020-12-28 ENCOUNTER — Other Ambulatory Visit: Payer: Self-pay

## 2020-12-28 ENCOUNTER — Ambulatory Visit (INDEPENDENT_AMBULATORY_CARE_PROVIDER_SITE_OTHER): Payer: BC Managed Care – PPO | Admitting: Obstetrics and Gynecology

## 2020-12-28 ENCOUNTER — Ambulatory Visit (INDEPENDENT_AMBULATORY_CARE_PROVIDER_SITE_OTHER): Payer: No Typology Code available for payment source

## 2020-12-28 VITALS — BP 108/70 | Ht 63.0 in | Wt 160.0 lb

## 2020-12-28 DIAGNOSIS — Z362 Encounter for other antenatal screening follow-up: Secondary | ICD-10-CM | POA: Diagnosis not present

## 2020-12-28 DIAGNOSIS — Z3A32 32 weeks gestation of pregnancy: Secondary | ICD-10-CM

## 2020-12-28 DIAGNOSIS — Z3480 Encounter for supervision of other normal pregnancy, unspecified trimester: Secondary | ICD-10-CM

## 2020-12-28 NOTE — Patient Instructions (Signed)
Pregnancy and Vaccinations Vaccines can help to keep you healthy. There are some vaccines that should be given before pregnancy and some that should be given during pregnancy. How does this affect me? If you are pregnant or thinking about getting pregnant, talk with your health care provider about what vaccines are right for you. How does this affect my baby? Usually, the benefits of receiving vaccines during pregnancy outweigh the risks of harm to you or your baby if:  The risk of being exposed to a disease is high.  Infection would pose a risk to you or your unborn baby.  The vaccine is not likely to cause harm. Vaccines can help protect your baby from some diseases until he or she is old enough to get the vaccine. What can I do to lower my risk? When you receive the recommended vaccines, it helps to protect you from getting certain diseases and passing them on to your baby. Should I receive vaccines before pregnancy? If possible, make sure that your vaccines are up to date before you become pregnant.  It is safe and important for you to receive weakened viral and weakened bacterial vaccines (inactivated vaccines) as needed before you are pregnant.  Live viral and live bacterial vaccines, such as the measles, mumps, and rubella (MMR) vaccine, should be given 1 month or more before pregnancy. Sometimes, women become pregnant within 1 month of receiving a live vaccine that is not usually recommended during pregnancy. The U.S. Centers for Disease Control and Prevention (CDC) has reported that when this has happened, vaccines have not harmed pregnant women or their unborn babies. Should I receive vaccines during pregnancy? It is safe and important for you to receive some inactivated vaccines as needed during pregnancy. Until your baby can receive vaccines, your baby will get some protection from diseases through the vaccines that you receive while you are pregnant. During your pregnancy, you  should receive the following:  Influenza vaccine (the flu shot). ? The flu shot may protect you and your baby (up to 67 months of age) from some complications associated with strains of influenza that are covered by the vaccine. ? Pregnant women can receive the flu shot at any time during pregnancy.  Tetanus, diphtheria, and pertussis (Tdap) vaccine. ? The Tdap vaccine will help to prevent whooping cough (pertussis) in you and your baby. ? You should receive 1 dose of this vaccine during each pregnancy. It is recommended that pregnant women receive this vaccine between 27 and 36 weeks of pregnancy.   Should I receive vaccines after pregnancy?  It is safe and important for you to receive vaccines as needed after pregnancy. Some are safe to have if you are breastfeeding. Other vaccines may not be safe to have until after you have stopped breastfeeding.  If you did not receive the Tdap vaccine during your pregnancy and have never received a Tdap vaccine, you should receive that vaccine right after you give birth to your baby (delivery).  If you are not immune to measles, mumps, rubella, or chickenpox (varicella), you should receive those vaccines within days after delivery.  It is important to talk with your health care provider about what vaccines you may need after delivery. What if I am pregnant and I plan to travel internationally? If you are pregnant and you are planning to travel internationally, talk with your health care provider at least 4-6 weeks before your trip. Depending on the country you are planning to visit, you may need to  take special precautions or get certain vaccines to prevent disease. Vaccines that may be recommended for pregnant international travelers include:  Influenza (the flu shot).  Tetanus and diphtheria (Td) or Tdap.  Hepatitis B (HepB).  Hepatitis A (HepA). Your health care provider can help you decide if you need vaccines and if the benefits outweigh the  risk of disease exposure. Follow these instructions at home:  Take over-the-counter and prescription medicines as told by your health care provider.  Keep all follow-up visits as told by your health care provider. This is important. Questions to ask your health care provider:  What vaccines are safe during pregnancy?  What are the risks of vaccines during pregnancy?  What are the potential side effects of vaccines during or after pregnancy?  When should I get vaccines during pregnancy? Contact a health care provider if you:  Believe you have had a reaction to a vaccine.  Have concerns or questions about a vaccine.  Become pregnant within 1 month after you have received a live vaccine. Summary  Vaccines are the most effective way to prevent certain diseases.  Many vaccines are safe to receive during pregnancy.  Some vaccines are recommended during pregnancy to protect you and your baby from getting sick.  If you are pregnant or planning to become pregnant, talk with your health care provider about what vaccines are right for you. This information is not intended to replace advice given to you by your health care provider. Make sure you discuss any questions you have with your health care provider. Document Revised: 03/31/2020 Document Reviewed: 11/27/2018 Elsevier Patient Education  2021 Elsevier Inc.   [place].aspx">  Third Trimester of Pregnancy  The third trimester of pregnancy is from week 28 through week 40. This is months 7 through 9. The third trimester is a time when the unborn baby (fetus) is growing rapidly. At the end of the ninth month, the fetus is about 20 inches long and weighs 6-10 pounds. Body changes during your third trimester During the third trimester, your body will continue to go through many changes. The changes vary and generally return to normal after your baby is born. Physical  changes  Your weight will continue to increase. You can expect to gain 25-35 pounds (11-16 kg) by the end of the pregnancy if you begin pregnancy at a normal weight. If you are underweight, you can expect to gain 28-40 lb (about 13-18 kg), and if you are overweight, you can expect to gain 15-25 lb (about 7-11 kg).  You may begin to get stretch marks on your hips, abdomen, and breasts.  Your breasts will continue to grow and may hurt. A yellow fluid (colostrum) may leak from your breasts. This is the first milk you are producing for your baby.  You may have changes in your hair. These can include thickening of your hair, rapid growth, and changes in texture. Some people also have hair loss during or after pregnancy, or hair that feels dry or thin.  Your belly button may stick out.  You may notice more swelling in your hands, face, or ankles. Health changes  You may have heartburn.  You may have constipation.  You may develop hemorrhoids.  You may develop swollen, bulging veins (varicose veins) in your legs.  You may have increased body aches in the pelvis, back, or thighs. This is due to weight gain and increased hormones that are relaxing your joints.  You may have increased tingling or numbness in your  hands, arms, and legs. The skin on your abdomen may also feel numb.  You may feel short of breath because of your expanding uterus. Other changes  You may urinate more often because the fetus is moving lower into your pelvis and pressing on your bladder.  You may have more problems sleeping. This may be caused by the size of your abdomen, an increased need to urinate, and an increase in your body's metabolism.  You may notice the fetus "dropping," or moving lower in your abdomen (lightening).  You may have increased vaginal discharge.  You may notice that you have pain around your pelvic bone as your uterus distends. Follow these instructions at home: Medicines  Follow your  health care provider's instructions regarding medicine use. Specific medicines may be either safe or unsafe to take during pregnancy. Do not take any medicines unless approved by your health care provider.  Take a prenatal vitamin that contains at least 600 micrograms (mcg) of folic acid. Eating and drinking  Eat a healthy diet that includes fresh fruits and vegetables, whole grains, good sources of protein such as meat, eggs, or tofu, and low-fat dairy products.  Avoid raw meat and unpasteurized juice, milk, and cheese. These carry germs that can harm you and your baby.  Eat 4 or 5 small meals rather than 3 large meals a day.  You may need to take these actions to prevent or treat constipation: ? Drink enough fluid to keep your urine pale yellow. ? Eat foods that are high in fiber, such as beans, whole grains, and fresh fruits and vegetables. ? Limit foods that are high in fat and processed sugars, such as fried or sweet foods. Activity  Exercise only as directed by your health care provider. Most people can continue their usual exercise routine during pregnancy. Try to exercise for 30 minutes at least 5 days a week. Stop exercising if you experience contractions in the uterus.  Stop exercising if you develop pain or cramping in the lower abdomen or lower back.  Avoid heavy lifting.  Do not exercise if it is very hot or humid or if you are at a high altitude.  If you choose to, you may continue to have sex unless your health care provider tells you not to. Relieving pain and discomfort  Take frequent breaks and rest with your legs raised (elevated) if you have leg cramps or low back pain.  Take warm sitz baths to soothe any pain or discomfort caused by hemorrhoids. Use hemorrhoid cream if your health care provider approves.  Wear a supportive bra to prevent discomfort from breast tenderness.  If you develop varicose veins: ? Wear support hose as told by your health care  provider. ? Elevate your feet for 15 minutes, 3-4 times a day. ? Limit salt in your diet. Safety  Talk to your health care provider before traveling far distances.  Do not use hot tubs, steam rooms, or saunas.  Wear your seat belt at all times when driving or riding in a car.  Talk with your health care provider if someone is verbally or physically abusive to you. Preparing for birth To prepare for the arrival of your baby:  Take prenatal classes to understand, practice, and ask questions about labor and delivery.  Visit the hospital and tour the maternity area.  Purchase a rear-facing car seat and make sure you know how to install it in your car.  Prepare the baby's room or sleeping area. Make sure  to remove all pillows and stuffed animals from the baby's crib to prevent suffocation. General instructions  Avoid cat litter boxes and soil used by cats. These carry germs that can cause birth defects in the baby. If you have a cat, ask someone to clean the litter box for you.  Do not douche or use tampons. Do not use scented sanitary pads.  Do not use any products that contain nicotine or tobacco, such as cigarettes, e-cigarettes, and chewing tobacco. If you need help quitting, ask your health care provider.  Do not use any herbal remedies, illegal drugs, or medicines that were not prescribed to you. Chemicals in these products can harm your baby.  Do not drink alcohol.  You will have more frequent prenatal exams during the third trimester. During a routine prenatal visit, your health care provider will do a physical exam, perform tests, and discuss your overall health. Keep all follow-up visits. This is important. Where to find more information  American Pregnancy Association: americanpregnancy.[place] and Gynecologists: PoolDevices.com.pt  Office on [employer]: KeywordPortfolios.com.br Contact a health care provider if  you have:  A fever.  Mild pelvic cramps, pelvic pressure, or nagging pain in your abdominal area or lower back.  Vomiting or diarrhea.  Bad-smelling vaginal discharge or foul-smelling urine.  Pain when you urinate.  A headache that does not go away when you take medicine.  Visual changes or see spots in front of your eyes. Get help right away if:  Your water breaks.  You have regular contractions less than 5 minutes apart.  You have spotting or bleeding from your vagina.  You have severe abdominal pain.  You have difficulty breathing.  You have chest pain.  You have fainting spells.  You have not felt your baby move for the time period told by your health care provider.  You have new or increased pain, swelling, or redness in an arm or leg. Summary  The third trimester of pregnancy is from week 28 through week 40 (months 7 through 9).  You may have more problems sleeping. This can be caused by the size of your abdomen, an increased need to urinate, and an increase in your body's metabolism.  You will have more frequent prenatal exams during the third trimester. Keep all follow-up visits. This is important. This information is not intended to replace advice given to you by your health care provider. Make sure you discuss any questions you have with your health care provider. Document Revised: 03/31/2020 Document Reviewed: 02/05/2020 Elsevier Patient Education  2021 [employer].

## 2020-12-28 NOTE — Progress Notes (Signed)
Routine Prenatal Care Visit  Subjective  Mandy Reeves is a 25 y.o. G2P1001 at [redacted]w[redacted]d being seen today for ongoing prenatal care.  She is currently monitored for the following issues for this low-risk pregnancy and has Current moderate episode of major depressive disorder without prior episode (HCC); GAD (generalized anxiety disorder); ADHD (attention deficit hyperactivity disorder), predominantly hyperactive impulsive type; Easy bruising; Cold intolerance; Bacterial vaginosis; Supervision of other normal pregnancy, antepartum; and Panic attack on their problem list.  ----------------------------------------------------------------------------------- Patient reports no complaints.   Contractions: Not present. Vag. Bleeding: None.  Movement: Present. Denies leaking of fluid.  ----------------------------------------------------------------------------------- The following portions of the patient's history were reviewed and updated as appropriate: allergies, current medications, past family history, past medical history, past social history, past surgical history and problem list. Problem list updated.   Objective  Blood pressure 108/70, height 5\' 3"  (1.6 m), weight 160 lb (72.6 kg), last menstrual period 05/20/2020. Pregravid weight 136 lb (61.7 kg) Total Weight Gain 24 lb (10.9 kg) Urinalysis:      Fetal Status: Fetal Heart Rate (bpm): 135 Fundal Height: 30 cm Movement: Present     General:  Alert, oriented and cooperative. Patient is in no acute distress.  Skin: Skin is warm and dry. No rash noted.   Cardiovascular: Normal heart rate noted  Respiratory: Normal respiratory effort, no problems with respiration noted  Abdomen: Soft, gravid, appropriate for gestational age. Pain/Pressure: Absent     Pelvic:  Cervical exam deferred        Extremities: Normal range of motion.  Edema: None  Mental Status: Normal mood and affect. Normal behavior. Normal judgment and thought content.      Assessment   25 y.o. G2P1001 at [redacted]w[redacted]d by  02/21/2021, by Ultrasound presenting for routine prenatal visit  Plan   Pregnancy#2 Problems (from 05/20/20 to present)    Problem Noted Resolved   Supervision of other normal pregnancy, antepartum 07/14/2020 by 09/13/2020, MD No   Overview Addendum 12/28/2020  4:28 PM by 12/30/2020, MD    [facility] Prenatal Labs  Dating 9 wk u/s Blood type: O/Positive/-- (09/08 1204)   Genetic Screen declines Antibody:Negative (09/08 1204)  Anatomic 06-23-2000 Complete with R pelviectasis - f/u at 32 weeks normal Rubella: 1.28 (09/08 1204) Varicella: immune  GTT  Third trimester: 98 RPR: Non Reactive (09/08 1204)   Rhogam  n/a HBsAg: Negative (09/08 1204)   TDaP vaccine  Declined                    Flu Shot: Declines COVID: unvaccinated, prior covid in 2021 HIV: Non Reactive (09/08 1204)   Baby Food  Breast                               GBS:   Contraception  pillls Pap: NILM  CBB     CS/VBAC  hx vaginal delivery   Support Person  mother08-18-2001           Previous Version   GAD (generalized anxiety disorder) 01/27/2013 by 01/29/2013, MD No   Overview Signed 08/09/2020 11:11 AM by 10/09/2020, CNM    Started on Lexapro trial 10/4  Lengthy reported hx of panic attacks.          Provided with information regarding covid vaccination in pregnancy, encouraged Discussed TDAP vaccination- she declines today.  Growth 12/4 normal today.  Gestational age appropriate obstetric precautions including but not limited to vaginal bleeding, contractions, leaking of fluid and fetal movement were reviewed in detail with the patient.    Return in about 2 weeks (around 01/11/2021) for ROB in person.  Natale Milch MD [facility], [facility] 12/28/2020, 4:28 PM

## 2020-12-30 ENCOUNTER — Encounter: Payer: BC Managed Care – PPO | Admitting: Obstetrics and Gynecology

## 2020-12-30 ENCOUNTER — Ambulatory Visit: Payer: BC Managed Care – PPO

## 2021-01-11 ENCOUNTER — Other Ambulatory Visit: Payer: Self-pay

## 2021-01-11 ENCOUNTER — Ambulatory Visit (INDEPENDENT_AMBULATORY_CARE_PROVIDER_SITE_OTHER): Payer: BC Managed Care – PPO | Admitting: Obstetrics

## 2021-01-11 VITALS — BP 118/74 | Wt 164.0 lb

## 2021-01-11 DIAGNOSIS — Z3480 Encounter for supervision of other normal pregnancy, unspecified trimester: Secondary | ICD-10-CM

## 2021-01-11 DIAGNOSIS — Z3A34 34 weeks gestation of pregnancy: Secondary | ICD-10-CM

## 2021-01-11 NOTE — Progress Notes (Signed)
No vb. No lof.  

## 2021-01-11 NOTE — Progress Notes (Signed)
Routine Prenatal Care Visit  Subjective  Mandy Reeves is a 25 y.o. G2P1001 at [redacted]w[redacted]d being seen today for ongoing prenatal care.  She is currently monitored for the following issues for this high-risk pregnancy and has Current moderate episode of major depressive disorder without prior episode (HCC); GAD (generalized anxiety disorder); ADHD (attention deficit hyperactivity disorder), predominantly hyperactive impulsive type; Easy bruising; Cold intolerance; Bacterial vaginosis; Supervision of other normal pregnancy, antepartum; and Panic attack on their problem list.  ----------------------------------------------------------------------------------- Patient reports no complaints.   Contractions: Not present. Vag. Bleeding: None.  Movement: Present. Leaking Fluid denies.  ----------------------------------------------------------------------------------- The following portions of the patient's history were reviewed and updated as appropriate: allergies, current medications, past family history, past medical history, past social history, past surgical history and problem list. Problem list updated.  Objective  Blood pressure 118/74, weight 164 lb (74.4 kg), last menstrual period 05/20/2020. Pregravid weight 136 lb (61.7 kg) Total Weight Gain 28 lb (12.7 kg) Urinalysis: Urine Protein    Urine Glucose    Fetal Status:     Movement: Present     General:  Alert, oriented and cooperative. Patient is in no acute distress.  Skin: Skin is warm and dry. No rash noted.   Cardiovascular: Normal heart rate noted  Respiratory: Normal respiratory effort, no problems with respiration noted  Abdomen: Soft, gravid, appropriate for gestational age. Pain/Pressure: Present     Pelvic:  Cervical exam deferred        Extremities: Normal range of motion.     Mental Status: Normal mood and affect. Normal behavior. Normal judgment and thought content.   Assessment   25 y.o. G2P1001 at [redacted]w[redacted]d by  02/21/2021,  by Ultrasound presenting for routine prenatal visit  Plan   Pregnancy#2 Problems (from 05/20/20 to present)    Problem Noted Resolved   Supervision of other normal pregnancy, antepartum 07/14/2020 by Conard Novak, MD No   Overview Addendum 12/28/2020  4:28 PM by Natale Milch, MD    [facility]inic [facility] Prenatal Labs  Dating 9 wk u/s Blood type: O/Positive/-- (09/08 1204)   Genetic Screen declines Antibody:Negative (09/08 1204)  Anatomic [place] Complete with R pelviectasis - f/u at 32 weeks normal Rubella: 1.28 (09/08 1204) Varicella: immune  GTT  Third trimester: 98 RPR: Non Reactive (09/08 1204)   Rhogam  n/a HBsAg: Negative (09/08 1204)   TDaP vaccine  Declined                    Flu Shot: Declines COVID: unvaccinated, prior covid in 2021 HIV: Non Reactive (09/08 1204)   Baby Food  Breast                               GBS:   Contraception  pillls Pap: NILM  CBB     CS/VBAC  hx vaginal delivery   Support Person  motherSue Reeves           Previous Version   GAD (generalized anxiety disorder) 01/27/2013 by Chauncey Mann, MD No   Overview Signed 08/09/2020 11:11 AM by Mirna Mires, CNM    Started on Lexapro trial 10/4  Lengthy reported hx of panic attacks.          Preterm labor symptoms and general obstetric precautions including but not limited to vaginal bleeding, contractions, leaking of fluid and fetal movement were reviewed in detail with the patient. Please refer to After Visit  Summary for other counseling recommendations.   Return in about 2 weeks (around 01/25/2021) for return OB, GBS cx.  Mirna Mires, CNM  01/11/2021 4:11 PM

## 2021-01-20 ENCOUNTER — Other Ambulatory Visit: Payer: Self-pay

## 2021-01-20 ENCOUNTER — Encounter: Payer: Self-pay | Admitting: Obstetrics and Gynecology

## 2021-01-20 ENCOUNTER — Observation Stay
Admission: EM | Admit: 2021-01-20 | Discharge: 2021-01-20 | Disposition: A | Discharge status: Home / Self Care | Payer: BC Managed Care – PPO | Attending: Obstetrics and Gynecology | Admitting: Obstetrics and Gynecology

## 2021-01-20 DIAGNOSIS — Z3A35 35 weeks gestation of pregnancy: Secondary | ICD-10-CM | POA: Insufficient documentation

## 2021-01-20 DIAGNOSIS — F411 Generalized anxiety disorder: Secondary | ICD-10-CM

## 2021-01-20 DIAGNOSIS — O418X30 Other specified disorders of amniotic fluid and membranes, third trimester, not applicable or unspecified: Secondary | ICD-10-CM | POA: Diagnosis not present

## 2021-01-20 DIAGNOSIS — O4193X0 Disorder of amniotic fluid and membranes, unspecified, third trimester, not applicable or unspecified: Secondary | ICD-10-CM | POA: Diagnosis not present

## 2021-01-20 DIAGNOSIS — Z3480 Encounter for supervision of other normal pregnancy, unspecified trimester: Secondary | ICD-10-CM

## 2021-01-20 LAB — RUPTURE OF MEMBRANE (ROM)PLUS: Rom Plus: NEGATIVE

## 2021-01-20 MED ORDER — ACETAMINOPHEN 325 MG PO TABS: 650.0000 mg | ORAL_TABLET | ORAL | Status: DC | PRN

## 2021-01-20 NOTE — OB Triage Note (Signed)
Pt is a 24y/o G2P1 at [redacted]w[redacted]d with c/o PSROM at 530pm. Pt states +FM. Pt denies CTX and VB. Pt states she has had some irregular cramping. Pt states that she is currently  not leaking anymore. Monitors applied and assessing. Initial [id number].

## 2021-01-20 NOTE — Discharge Summary (Signed)
Physician Final Progress Note  Patient ID: Mandy Reeves MRN: [id number] DOB/AGE: [birth year] 24 y.o.  Admit date: 01/20/2021 Admitting provider: Vena Austria, MD Discharge date: 01/20/2021   Admission Diagnoses: Leaking fluid  Discharge Diagnoses:  Active Problems:   Labor and delivery indication for care or intervention   25 y.o. G2P1001 at [redacted]w[redacted]d presenting with loss of fluid this afternoon.  No further leakage since.  +FM, no VB, no contractions.  On presentation no evidence of gross SROM.  ROM plus obtained and negative.  Blood pressure 112/71, pulse 92, temperature 98 F (36.7 C), temperature source Oral, resp. rate 16, last menstrual period 05/20/2020.  Pregnancy#2 Problems (from 05/20/20 to present)    Problem Noted Resolved   Supervision of other normal pregnancy, antepartum 07/14/2020 by Conard Novak, MD No   Overview Addendum 12/28/2020  4:28 PM by Natale Milch, MD    Clinic [facility] Prenatal Labs  Dating 9 wk u/s Blood type: O/Positive/-- (09/08 1204)   Genetic Screen declines Antibody:Negative (09/08 1204)  Anatomic [place] Complete with R pelviectasis - f/u at 32 weeks normal Rubella: 1.28 (09/08 1204) Varicella: immune  GTT  Third trimester: 98 RPR: Non Reactive (09/08 1204)   Rhogam  n/a HBsAg: Negative (09/08 1204)   TDaP vaccine  Declined                    Flu Shot: Declines COVID: unvaccinated, prior covid in 2021 HIV: Non Reactive (09/08 1204)   Baby Food  Breast                               GBS:   Contraception  pillls Pap: NILM  CBB     CS/VBAC  hx vaginal delivery   Support Person  [name]           Previous Version   GAD (generalized anxiety disorder) 01/27/2013 by Chauncey Mann, MD No   Overview Signed 08/09/2020 11:11 AM by Mirna Mires, CNM    Started on Lexapro trial 10/4  Lengthy reported hx of panic attacks.          Consults: None  Significant Findings/ Diagnostic Studies:  Results for orders placed or  performed during the hospital encounter of 01/20/21 (from the past 24 hour(s))  ROM Plus ([facility] only)     Status: None   Collection Time: 01/20/21  7:18 PM  Result Value Ref Range   Rom Plus NEGATIVE    Baseline: 140 Variability: moderate Accelerations: present Decelerations: absent Tocometry: none The patient was monitored for 30 minutes, fetal heart rate tracing was deemed reactive, category I tracing,  Procedures: NST  Discharge Condition: good  Disposition: Discharge disposition: 01-Home or Self Care       Diet: Regular diet  Discharge Activity: Activity as tolerated  Discharge Instructions    Discharge activity:  No Restrictions   Complete by: As directed    Discharge diet:  No restrictions   Complete by: As directed    Fetal Kick Count:  Lie on our left side for one hour after a meal, and count the number of times your baby kicks.  If it is less than 5 times, get up, move around and drink some juice.  Repeat the test 30 minutes later.  If it is still less than 5 kicks in an hour, notify your doctor.   Complete by: As directed    LABOR:  When  conractions begin, you should start to time them from the beginning of one contraction to the beginning  of the next.  When contractions are 5 - 10 minutes apart or less and have been regular for at least an hour, you should call your health care provider.   Complete by: As directed    No sexual activity restrictions   Complete by: As directed    Notify physician for bleeding from the vagina   Complete by: As directed    Notify physician for blurring of vision or spots before the eyes   Complete by: As directed    Notify physician for chills or fever   Complete by: As directed    Notify physician for fainting spells, "black outs" or loss of consciousness   Complete by: As directed    Notify physician for increase in vaginal discharge   Complete by: As directed    Notify physician for leaking of fluid   Complete by: As  directed    Notify physician for pain or burning when urinating   Complete by: As directed    Notify physician for pelvic pressure (sudden increase)   Complete by: As directed    Notify physician for severe or continued nausea or vomiting   Complete by: As directed    Notify physician for sudden gushing of fluid from the vagina (with or without continued leaking)   Complete by: As directed    Notify physician for sudden, constant, or occasional abdominal pain   Complete by: As directed    Notify physician if baby moving less than usual   Complete by: As directed      Allergies as of 01/20/2021   No Known Allergies     Medication List    STOP taking these medications   escitalopram 10 MG tablet Commonly known as: Lexapro   hydrOXYzine 25 MG tablet Commonly known as: ATARAX/VISTARIL     TAKE these medications   cetirizine 10 MG tablet Commonly known as: ZYRTEC Take 10 mg by mouth daily.   multivitamin-prenatal 27-0.8 MG Tabs tablet Take 1 tablet by mouth daily at 12 noon.        Total time spent taking care of this patient: 30 minutes  Signed: Vena Austria 01/20/2021, 7:59 PM

## 2021-01-25 ENCOUNTER — Other Ambulatory Visit (HOSPITAL_COMMUNITY)
Admission: RE | Admit: 2021-01-25 | Discharge: 2021-01-25 | Disposition: A | Discharge status: Home / Self Care | Payer: No Typology Code available for payment source | Source: Ambulatory Visit | Attending: Advanced Practice Midwife | Admitting: Advanced Practice Midwife

## 2021-01-25 ENCOUNTER — Ambulatory Visit (INDEPENDENT_AMBULATORY_CARE_PROVIDER_SITE_OTHER): Payer: BC Managed Care – PPO | Admitting: Advanced Practice Midwife

## 2021-01-25 ENCOUNTER — Encounter: Payer: Self-pay | Admitting: Advanced Practice Midwife

## 2021-01-25 ENCOUNTER — Other Ambulatory Visit: Payer: Self-pay

## 2021-01-25 VITALS — BP 122/76 | Wt 167.0 lb

## 2021-01-25 DIAGNOSIS — Z3685 Encounter for antenatal screening for Streptococcus B: Secondary | ICD-10-CM | POA: Diagnosis not present

## 2021-01-25 DIAGNOSIS — Z3A36 36 weeks gestation of pregnancy: Secondary | ICD-10-CM

## 2021-01-25 DIAGNOSIS — Z3480 Encounter for supervision of other normal pregnancy, unspecified trimester: Secondary | ICD-10-CM

## 2021-01-25 DIAGNOSIS — Z113 Encounter for screening for infections with a predominantly sexual mode of transmission: Secondary | ICD-10-CM | POA: Insufficient documentation

## 2021-01-25 DIAGNOSIS — Z369 Encounter for antenatal screening, unspecified: Secondary | ICD-10-CM

## 2021-01-25 NOTE — Progress Notes (Signed)
Routine Prenatal Care Visit  Subjective  Mandy Reeves is a 25 y.o. G2P1001 at [redacted]w[redacted]d being seen today for ongoing prenatal care.  She is currently monitored for the following issues for this low-risk pregnancy and has Current moderate episode of major depressive disorder without prior episode (HCC); GAD (generalized anxiety disorder); ADHD (attention deficit hyperactivity disorder), predominantly hyperactive impulsive type; Easy bruising; Cold intolerance; Bacterial vaginosis; Supervision of other normal pregnancy, antepartum; Panic attack; and Labor and delivery indication for care or intervention on their problem list.  ----------------------------------------------------------------------------------- Patient reports no complaints.  She delivered G1 at 38 weeks, 2 hour labor with 20 minutes of pushing. We discussed s/s labor, when to go to hospital. Contractions: Not present. Vag. Bleeding: None.  Movement: Present. Leaking Fluid denies.  ----------------------------------------------------------------------------------- The following portions of the patient's history were reviewed and updated as appropriate: allergies, current medications, past family history, past medical history, past social history, past surgical history and problem list. Problem list updated.  Objective  Blood pressure 122/76, weight 167 lb (75.8 kg), last menstrual period 05/20/2020. Pregravid weight 136 lb (61.7 kg) Total Weight Gain 31 lb (14.1 kg) Urinalysis: Urine Protein    Urine Glucose    Fetal Status: Fetal Heart Rate (bpm): 141 Fundal Height: 35 cm Movement: Present  Presentation: Vertex  General:  Alert, oriented and cooperative. Patient is in no acute distress.  Skin: Skin is warm and dry. No rash noted.   Cardiovascular: Normal heart rate noted  Respiratory: Normal respiratory effort, no problems with respiration noted  Abdomen: Soft, gravid, appropriate for gestational age. Pain/Pressure: Absent      Pelvic:  GBS/aptima collected Dilation: 3 Effacement (%): 60 Station: -3  Extremities: Normal range of motion.     Mental Status: Normal mood and affect. Normal behavior. Normal judgment and thought content.   Assessment   25 y.o. G2P1001 at [redacted]w[redacted]d by  02/21/2021, by Ultrasound presenting for routine prenatal visit  Plan   Pregnancy#2 Problems (from 05/20/20 to present)    Problem Noted Resolved   Supervision of other normal pregnancy, antepartum 07/14/2020 by Conard Novak, MD No   Overview Addendum 12/28/2020  4:28 PM by Natale Milch, MD    Clinic [facility] Prenatal Labs  Dating 9 wk u/s Blood type: O/Positive/-- (09/08 1204)   Genetic Screen declines Antibody:Negative (09/08 1204)  Anatomic [place] Complete with R pelviectasis - f/u at 32 weeks normal Rubella: 1.28 (09/08 1204) Varicella: immune  GTT  Third trimester: 98 RPR: Non Reactive (09/08 1204)   Rhogam  n/a HBsAg: Negative (09/08 1204)   TDaP vaccine  Declined                    Flu Shot: Declines COVID: unvaccinated, prior covid in 2021 HIV: Non Reactive (09/08 1204)   Baby Food  Breast                               GBS:   Contraception  pillls Pap: NILM  CBB     CS/VBAC  hx vaginal delivery   Support Person  [name]           Previous Version   GAD (generalized anxiety disorder) 01/27/2013 by Chauncey Mann, MD No   Overview Signed 08/09/2020 11:11 AM by Mirna Mires, CNM    Started on Lexapro trial 10/4  Lengthy reported hx of panic attacks.  Preterm labor symptoms and general obstetric precautions including but not limited to vaginal bleeding, contractions, leaking of fluid and fetal movement were reviewed in detail with the patient.   Return in about 1 week (around 02/01/2021) for rob.  Tresea Mall, CNM 01/25/2021 4:16 PM

## 2021-01-25 NOTE — Progress Notes (Signed)
No vb. No lof. GBS/Aptima today.  

## 2021-01-27 LAB — CERVICOVAGINAL ANCILLARY ONLY
Chlamydia: NEGATIVE
Comment: NEGATIVE
Comment: NEGATIVE
Comment: NORMAL
Neisseria Gonorrhea: NEGATIVE
Trichomonas: NEGATIVE

## 2021-01-27 LAB — STREP GP B NAA: Strep Gp B NAA: NEGATIVE

## 2021-01-31 ENCOUNTER — Telehealth: Payer: Self-pay

## 2021-01-31 NOTE — Telephone Encounter (Signed)
Pt calling; has had a stomach upset for about a week; watery stools; feels crappy.  [phone]  Pt states no fever; mild contractions.  Adv to sip clear liquids 24hrs, then bland food for 24hrs, then work up to reg diet as tolerated.  May take Immodium, kaopectate, donnegel PG.

## 2021-02-02 ENCOUNTER — Other Ambulatory Visit: Payer: Self-pay

## 2021-02-02 ENCOUNTER — Ambulatory Visit (INDEPENDENT_AMBULATORY_CARE_PROVIDER_SITE_OTHER): Payer: BC Managed Care – PPO | Admitting: Obstetrics

## 2021-02-02 VITALS — BP 120/76 | Wt 171.0 lb

## 2021-02-02 DIAGNOSIS — Z3480 Encounter for supervision of other normal pregnancy, unspecified trimester: Secondary | ICD-10-CM

## 2021-02-02 DIAGNOSIS — Z3A37 37 weeks gestation of pregnancy: Secondary | ICD-10-CM

## 2021-02-02 NOTE — Progress Notes (Signed)
No vb. No lof. Cervical check today  °

## 2021-02-02 NOTE — Progress Notes (Signed)
Routine Prenatal Care Visit  Subjective  Mandy Reeves is a 25 y.o. G2P1001 at [redacted]w[redacted]d being seen today for ongoing prenatal care.  She is currently monitored for the following issues for this high-risk pregnancy and has Current moderate episode of major depressive disorder without prior episode (HCC); GAD (generalized anxiety disorder); ADHD (attention deficit hyperactivity disorder), predominantly hyperactive impulsive type; Easy bruising; Cold intolerance; Bacterial vaginosis; Supervision of other normal pregnancy, antepartum; Panic attack; and Labor and delivery indication for care or intervention on their problem list.  ----------------------------------------------------------------------------------- Patient reports no complaints.   Contractions: Irregular. Vag. Bleeding: None.  Movement: Present. Leaking Fluid denies.  ----------------------------------------------------------------------------------- The following portions of the patient's history were reviewed and updated as appropriate: allergies, current medications, past family history, past medical history, past social history, past surgical history and problem list. Problem list updated.  Objective  Blood pressure 120/76, weight 171 lb (77.6 kg), last menstrual period 05/20/2020. Pregravid weight 136 lb (61.7 kg) Total Weight Gain 35 lb (15.9 kg) Urinalysis: Urine Protein    Urine Glucose    Fetal Status:     Movement: Present     General:  Alert, oriented and cooperative. Patient is in no acute distress.  Skin: Skin is warm and dry. No rash noted.   Cardiovascular: Normal heart rate noted  Respiratory: Normal respiratory effort, no problems with respiration noted  Abdomen: Soft, gravid, appropriate for gestational age. Pain/Pressure: Absent     Pelvic:  Cervical exam performed      midway, very soft, 3.5 cms/60%/-3  Extremities: Normal range of motion.     Mental Status: Normal mood and affect. Normal behavior. Normal  judgment and thought content.   Assessment   25 y.o. G2P1001 at [redacted]w[redacted]d by  02/21/2021, by Ultrasound presenting for routine prenatal visit  Plan   Pregnancy#2 Problems (from 05/20/20 to present)    Problem Noted Resolved   Supervision of other normal pregnancy, antepartum 07/14/2020 by Conard Novak, MD No   Overview Addendum 02/02/2021  4:16 PM by Mirna Mires, CNM    [facility]inic [facility] Prenatal Labs  Dating 9 wk u/s Blood type: O/Positive/-- (09/08 1204)   Genetic Screen declines Antibody:Negative (09/08 1204)  Anatomic [place] Complete with R pelviectasis - f/u at 32 weeks normal Rubella: 1.28 (09/08 1204) Varicella: immune  GTT  Third trimester: 98 RPR: Non Reactive (09/08 1204)   Rhogam  n/a HBsAg: Negative (09/08 1204)   TDaP vaccine  Declined                    Flu Shot: Declines COVID: unvaccinated, prior covid in 2021 HIV: Non Reactive (09/08 1204)   Baby Food  Breast                               GBS: negative  Contraception  pillls Pap: NILM  CBB     CS/VBAC  hx vaginal delivery   Support Person  [name]           Previous Version   GAD (generalized anxiety disorder) 01/27/2013 by Chauncey Mann, MD No   Overview Signed 08/09/2020 11:11 AM by Mirna Mires, CNM    Started on Lexapro trial 10/4  Lengthy reported hx of panic attacks.          Term labor symptoms and general obstetric precautions including but not limited to vaginal bleeding, contractions, leaking of fluid and fetal movement were  reviewed in detail with the patient. Please refer to After Visit Summary for other counseling recommendations.  Labor precautions. She is aware her labor is likely to be short and move quickly.  Return in about 1 week (around 02/09/2021) for return OB.  Mirna Mires, CNM  02/02/2021 4:17 PM

## 2021-02-09 ENCOUNTER — Other Ambulatory Visit: Payer: Self-pay

## 2021-02-09 ENCOUNTER — Ambulatory Visit (INDEPENDENT_AMBULATORY_CARE_PROVIDER_SITE_OTHER): Payer: BC Managed Care – PPO | Admitting: Obstetrics and Gynecology

## 2021-02-09 VITALS — BP 120/80 | Wt 172.0 lb

## 2021-02-09 DIAGNOSIS — Z3A38 38 weeks gestation of pregnancy: Secondary | ICD-10-CM

## 2021-02-09 DIAGNOSIS — Z3480 Encounter for supervision of other normal pregnancy, unspecified trimester: Secondary | ICD-10-CM

## 2021-02-09 LAB — POCT URINALYSIS DIPSTICK OB
Glucose, UA: NEGATIVE
POC,PROTEIN,UA: NEGATIVE

## 2021-02-09 NOTE — Progress Notes (Signed)
Routine Prenatal Care Visit  Subjective  Mandy Reeves is a 25 y.o. G2P1001 at [redacted]w[redacted]d being seen today for ongoing prenatal care.  She is currently monitored for the following issues for this low-risk pregnancy and has Current moderate episode of major depressive disorder without prior episode (HCC); GAD (generalized anxiety disorder); ADHD (attention deficit hyperactivity disorder), predominantly hyperactive impulsive type; Easy bruising; Cold intolerance; Bacterial vaginosis; Supervision of other normal pregnancy, antepartum; Panic attack; and Labor and delivery indication for care or intervention on their problem list.  ----------------------------------------------------------------------------------- Patient reports no complaints.   Contractions: Irregular. Vag. Bleeding: None.  Movement: Present. Denies leaking of fluid.  ----------------------------------------------------------------------------------- The following portions of the patient's history were reviewed and updated as appropriate: allergies, current medications, past family history, past medical history, past social history, past surgical history and problem list. Problem list updated.   Objective  Blood pressure 120/80, weight 172 lb (78 kg), last menstrual period 05/20/2020. Pregravid weight 136 lb (61.7 kg) Total Weight Gain 36 lb (16.3 kg) Urinalysis:      Fetal Status: Fetal Heart Rate (bpm): 140 Fundal Height: 37 cm Movement: Present  Presentation: Vertex  General:  Alert, oriented and cooperative. Patient is in no acute distress.  Skin: Skin is warm and dry. No rash noted.   Cardiovascular: Normal heart rate noted  Respiratory: Normal respiratory effort, no problems with respiration noted  Abdomen: Soft, gravid, appropriate for gestational age. Pain/Pressure: Absent     Pelvic:  Cervical exam performed Dilation: 4 Effacement (%): 60,70 Station: -3  Extremities: Normal range of motion.     ental Status:  Normal mood and affect. Normal behavior. Normal judgment and thought content.     Assessment   25 y.o. G2P1001 at [redacted]w[redacted]d by  02/21/2021, by Ultrasound presenting for routine prenatal visit  Plan   Pregnancy#2 Problems (from 05/20/20 to present)    Problem Noted Resolved   Supervision of other normal pregnancy, antepartum 07/14/2020 by Conard Novak, MD No   Overview Addendum 02/02/2021  4:16 PM by Mirna Mires, CNM    [facility]inic [facility] Prenatal Labs  Dating 9 wk u/s Blood type: O/Positive/-- (09/08 1204)   Genetic Screen declines Antibody:Negative (09/08 1204)  Anatomic [place] Complete with R pelviectasis - f/u at 32 weeks normal Rubella: 1.28 (09/08 1204) Varicella: immune  GTT  Third trimester: 98 RPR: Non Reactive (09/08 1204)   Rhogam  n/a HBsAg: Negative (09/08 1204)   TDaP vaccine  Declined                    Flu Shot: Declines COVID: unvaccinated, prior covid in 2021 HIV: Non Reactive (09/08 1204)   Baby Food  Breast                               GBS: negative  Contraception  pillls Pap: NILM  CBB     CS/VBAC  hx vaginal delivery   Support Person  [name]           Previous Version   GAD (generalized anxiety disorder) 01/27/2013 by Chauncey Mann, MD No   Overview Signed 08/09/2020 11:11 AM by Mirna Mires, CNM    Started on Lexapro trial 10/4  Lengthy reported hx of panic attacks.         Full term precautions including but not limited to vaginal bleeding, contractions, leaking of fluid and fetal movement were reviewed in  detail with the patient.    Return in about 1 week (around 02/16/2021) for ROB.  [name], CNM, MSN [facility], [facility] 02/09/2021, 4:19 PM

## 2021-02-10 ENCOUNTER — Other Ambulatory Visit: Payer: Self-pay

## 2021-02-10 ENCOUNTER — Encounter: Payer: Self-pay | Admitting: Advanced Practice Midwife

## 2021-02-10 ENCOUNTER — Observation Stay
Admission: EM | Admit: 2021-02-10 | Discharge: 2021-02-10 | Disposition: A | Discharge status: Home / Self Care | Payer: No Typology Code available for payment source | Attending: Obstetrics & Gynecology | Admitting: Obstetrics & Gynecology

## 2021-02-10 ENCOUNTER — Telehealth: Payer: Self-pay

## 2021-02-10 DIAGNOSIS — O99891 Other specified diseases and conditions complicating pregnancy: Secondary | ICD-10-CM

## 2021-02-10 DIAGNOSIS — O26893 Other specified pregnancy related conditions, third trimester: Secondary | ICD-10-CM | POA: Diagnosis not present

## 2021-02-10 DIAGNOSIS — R109 Unspecified abdominal pain: Secondary | ICD-10-CM | POA: Diagnosis not present

## 2021-02-10 DIAGNOSIS — O26899 Other specified pregnancy related conditions, unspecified trimester: Secondary | ICD-10-CM | POA: Diagnosis present

## 2021-02-10 DIAGNOSIS — Z3480 Encounter for supervision of other normal pregnancy, unspecified trimester: Secondary | ICD-10-CM

## 2021-02-10 DIAGNOSIS — R1030 Lower abdominal pain, unspecified: Secondary | ICD-10-CM | POA: Insufficient documentation

## 2021-02-10 DIAGNOSIS — Z3A38 38 weeks gestation of pregnancy: Secondary | ICD-10-CM | POA: Diagnosis not present

## 2021-02-10 DIAGNOSIS — F411 Generalized anxiety disorder: Secondary | ICD-10-CM

## 2021-02-10 NOTE — Final Progress Note (Signed)
Physician Final Progress Note  Patient ID: Mandy Reeves MRN: [id number] DOB/AGE: 30-Apr-1996 24 y.o.  Admit date: 02/10/2021 Admitting provider: Tresea Mall, CNM Discharge date: 02/10/2021  Admission Diagnoses: Principal Problem:   Abdominal pain affecting pregnancy\   38 weeks pregnancy  Discharge Diagnoses:  Principal Problem:   Abdominal pain affecting pregnancy  Consults: None  Significant Findings/ Diagnostic Studies:  Obstetrics Admission History & Physical   No chief complaint on file.   HPI:  25 y.o. G2P1001 @ [redacted]w[redacted]d (02/21/2021, by Ultrasound). Admitted on 02/10/2021:   Patient Active Problem List   Diagnosis Date Noted  . Labor and delivery, indication for care 02/10/2021  . Abdominal pain affecting pregnancy 02/10/2021  . Labor and delivery indication for care or intervention 01/20/2021  . Supervision of other normal pregnancy, antepartum 07/14/2020  . Bacterial vaginosis 03/11/2020  . Panic attack 02/10/2020  . Easy bruising 01/07/2019  . Cold intolerance 01/07/2019  . Current moderate episode of major depressive disorder without prior episode (HCC) 01/27/2013  . GAD (generalized anxiety disorder) 01/27/2013  . ADHD (attention deficit hyperactivity disorder), predominantly hyperactive impulsive type 01/27/2013     Presents for painful lower abdominal pain that began yesterday, does not radiate, is 4-5/10 level, context of pregnancy, no associated sx's or modifiers.  No VB or ROM.  Seen yesterday at [facility] for prenatal visit, was 4 cm dilate then and w similar sx's..   [facility] at: at [facility]. Pregnancy complicated by none.  ROS: A review of systems was performed and negative, except as stated in the above HPI. PMHx:  Past Medical History:  Diagnosis Date  . Anxiety   . Closed nondisplaced fracture of medial malleolus of left tibia 04/20/2017  . Depression   . Menorrhagia   . Orthostatic hypotension   . Sprain of left ankle 04/23/2017   PSHx:  Past  Surgical History:  Procedure Laterality Date  . TONSILLECTOMY AND ADENOIDECTOMY  05/2013   at age 33  . WISDOM TOOTH EXTRACTION  02/2020   Medications:  Medications Prior to Admission  Medication Sig Dispense Refill Last Dose  . cetirizine (ZYRTEC) 10 MG tablet Take 10 mg by mouth daily.   02/10/2021 at Unknown time  . Prenatal Vit-Fe Fumarate-FA (MULTIVITAMIN-PRENATAL) 27-0.8 MG TABS tablet Take 1 tablet by mouth daily at 12 noon.   02/10/2021 at Unknown time   Allergies: has No Known Allergies. OBHx:  OB History  Gravida Para Term Preterm AB Living  2 1 1  0 0 1  SAB IAB Ectopic Multiple Live Births  0 0 0 0 1    # Outcome Date GA Lbr Len/2nd Weight Sex Delivery Anes PTL Lv  2 Current           1 Term 02/02/16 [redacted]w[redacted]d 04:56 / 00:14 2920 g F Vag-Spont Local, Other  LIV   [redacted]w[redacted]d except as detailed in HPI.STM:HDQQIWLN/LGXQJJHERDEY  No family history of birth defects. Soc Hx: Alcohol: none and Recreational drug use: none  Objective:   Vitals:   02/10/21 1103  BP: 125/80  Pulse: 81  Resp: 17  Temp: 97.9 F (36.6 C)   Constitutional: Well nourished, well developed female in no acute distress.  HEENT: normal Skin: Warm and dry.  Cardiovascular:Regular rate and rhythm.   Extremity: trace to 1+ bilateral pedal edema Respiratory: Clear to auscultation bilateral. Normal respiratory effort Abdomen: gravid, ND, FHT present, mild tenderness on exam Back: no CVAT Neuro: DTRs 2+, Cranial nerves grossly intact Psych: Alert and Oriented x3. No memory deficits.  Normal mood and affect.  MS: normal gait, normal bilateral lower extremity ROM/strength/stability.  Pelvic exam: is not limited by body habitus EGBUS: within normal limits Vagina: within normal limits and with normal mucosa Cervix: CERVIX: 4 cm dilated, 80 effaced, -3 station, POSTERIOR,  presenting part Vtx Uterus: Uterus demonstrates irritability pattern.  Adnexa: not evaluated  EFM:FHR: 140 bpm, variability: moderate,   accelerations:  Present,  decelerations:  Absent Toco: Frequency: Every 10-15 minutes   Perinatal info:  Blood type: O positive Rubella- Immune Varicella -Immune TDaP tetanus status unknown to the patient RPR NR / HIV Neg/ HBsAg Neg   Assessment & Plan:   25 y.o. G2P1001 @ [redacted]w[redacted]d, Admitted on 02/10/2021:Lower abdominal pain, no s/sx labor based on exams and monitoring Risks of precipitous labor discussed, based on history.  Return if pain escalates  Or if ROM. PNV Labor precautions Keep f/u prenatal appt   Annamarie Major, MD, [facility], [facility] 02/10/2021  2:17 PM   Procedures: A NST procedure was performed with FHR monitoring and a normal baseline established, appropriate time of 20-40 minutes of evaluation, and accels >2 seen w 15x15 characteristics.  Results show a REACTIVE NST.     Discharge Condition: good  Disposition: Discharge disposition: 01-Home or Self Care       Diet: Regular diet  Discharge Activity: Activity as tolerated  Discharge Instructions    Call MD for:   Complete by: As directed    Worsening contractions or pain; leakage of fluid; bleeding.   Diet - low sodium heart healthy   Complete by: As directed    Increase activity slowly   Complete by: As directed      Allergies as of 02/10/2021   No Known Allergies     Medication List    TAKE these medications   cetirizine 10 MG tablet Commonly known as: ZYRTEC Take 10 mg by mouth daily.   multivitamin-prenatal 27-0.8 MG Tabs tablet Take 1 tablet by mouth daily at 12 noon.       Follow-up Information    Nadara Mustard, MD. Go in 1 week(s).   Specialty: Obstetrics and Gynecology Why: Lauralee Evener) next week Contact information: [address] [place] [address] [phone]               Total time spent taking care of this patient: 20 minutes  Signed: Letitia Libra 02/10/2021, 2:17 PM

## 2021-02-10 NOTE — Discharge Summary (Signed)
  See FPN 

## 2021-02-10 NOTE — OB Triage Note (Signed)
PT presents with c/o ctx since yesterday that occur a few times an hour. Pt reports ctx as being mild. +FM. Denies LOF/bleeding. VSS

## 2021-02-10 NOTE — Discharge Instructions (Signed)

## 2021-02-10 NOTE — Telephone Encounter (Signed)
Pt's mom, Baron Sane, calling; pt was seen in the office yesterday; in hosp this am; needs work - employer is requesting either light duty or out completely b/c she works in a [employer] and does a lot of heavy lifting.  [phone]

## 2021-02-11 NOTE — Telephone Encounter (Signed)
OK to provide note for out of work at this stage of pregnancy and with the symptoms of early labor and pain she is having.  Plz provide for her.

## 2021-02-13 ENCOUNTER — Inpatient Hospital Stay (HOSPITAL_COMMUNITY)
Admission: AD | Admit: 2021-02-13 | Discharge: 2021-02-13 | Disposition: A | Discharge status: Home / Self Care | Payer: No Typology Code available for payment source | Attending: Obstetrics and Gynecology | Admitting: Obstetrics and Gynecology

## 2021-02-13 ENCOUNTER — Other Ambulatory Visit: Payer: Self-pay

## 2021-02-13 DIAGNOSIS — O26893 Other specified pregnancy related conditions, third trimester: Secondary | ICD-10-CM | POA: Diagnosis not present

## 2021-02-13 DIAGNOSIS — R102 Pelvic and perineal pain: Secondary | ICD-10-CM | POA: Diagnosis not present

## 2021-02-13 DIAGNOSIS — Z3A38 38 weeks gestation of pregnancy: Secondary | ICD-10-CM | POA: Insufficient documentation

## 2021-02-13 DIAGNOSIS — Z87891 Personal history of nicotine dependence: Secondary | ICD-10-CM | POA: Diagnosis not present

## 2021-02-13 DIAGNOSIS — Z79899 Other long term (current) drug therapy: Secondary | ICD-10-CM | POA: Insufficient documentation

## 2021-02-13 DIAGNOSIS — O471 False labor at or after 37 completed weeks of gestation: Secondary | ICD-10-CM | POA: Insufficient documentation

## 2021-02-13 DIAGNOSIS — O479 False labor, unspecified: Secondary | ICD-10-CM

## 2021-02-13 NOTE — Discharge Instructions (Signed)

## 2021-02-13 NOTE — MAU Provider Note (Signed)
Chief Complaint:  Contractions   None    HPI: Mandy Reeves is a 25 y.o. G2P1001 at [redacted]w[redacted]d who presents to maternity admissions reporting irregular contractions and lower abdominal pain that started after having a membrane sweep at her OB office on Wednesday. She reports that "she just doesn't feel right". She does not think that she is in labor but wants to make sure what she is feeling is nothing wrong. She describe the pain as sharp, shooting, cramping in lower abdomen that radiates into pelvis and upper thighs. She denies leakage of fluid or vaginal bleeding. Endorses good fetal movement.   Pregnancy Course:   Past Medical History:  Diagnosis Date  . Anxiety   . Closed nondisplaced fracture of medial malleolus of left tibia 04/20/2017  . Depression   . Menorrhagia   . Orthostatic hypotension   . Sprain of left ankle 04/23/2017   OB History  Gravida Para Term Preterm AB Living  2 1 1  0 0 1  SAB IAB Ectopic Multiple Live Births  0 0 0 0 1    # Outcome Date GA Lbr Len/2nd Weight Sex Delivery Anes PTL Lv  2 Current           1 Term 02/02/16 [redacted]w[redacted]d 04:56 / 00:14 2920 g F Vag-Spont Local, Other  LIV   Past Surgical History:  Procedure Laterality Date  . TONSILLECTOMY AND ADENOIDECTOMY  05/2013   at age 71  . WISDOM TOOTH EXTRACTION  02/2020   Family History  Problem Relation Age of Onset  . Hypertension Father   . Stroke Father   . Cancer Maternal Grandfather 76       kidney  . Hypertension Maternal Grandfather   . Ovarian cancer Paternal Grandmother   . Hypertension Mother   . Diabetes Paternal Grandfather   . Cancer Other 65       BREAST  . Endometriosis Other   . Ovarian cancer Other   . Cervical cancer Other    Social History   Tobacco Use  . Smoking status: Former Smoker    Packs/day: 1.00    Years: 10.00    Pack years: 10.00    Types: Cigarettes  . Smokeless tobacco: Never Used  . Tobacco comment: Quit 2019; Started at age 64  Vaping Use  . Vaping Use:  Every day  . Last attempt to quit: 07/07/2018  . Substances: CBD  Substance Use Topics  . Alcohol use: No    Comment: 3-4 mixed drinks per week prior to pregnancy  . Drug use: No   No Known Allergies Medications Prior to Admission  Medication Sig Dispense Refill Last Dose  . cetirizine (ZYRTEC) 10 MG tablet Take 10 mg by mouth daily.     . Prenatal Vit-Fe Fumarate-FA (MULTIVITAMIN-PRENATAL) 27-0.8 MG TABS tablet Take 1 tablet by mouth daily at 12 noon.      I have reviewed patient's Past Medical Hx, Surgical Hx, Family Hx, Social Hx, medications and allergies.   ROS:  Review of Systems  Constitutional: Negative.   Respiratory: Negative.   Cardiovascular: Negative.   Gastrointestinal: Positive for abdominal pain.  Genitourinary:       Pelvic pressure  Neurological: Negative.   Psychiatric/Behavioral: Negative.     Physical Exam   Patient Vitals for the past 24 hrs:  BP Temp Pulse Resp  02/13/21 1548 127/74 -- 91 16  02/13/21 1523 118/68 98.1 F (36.7 C) -- 16   Constitutional: well-developed, well-nourished female in no acute distress.  Cardiovascular: normal rate Respiratory: normal effort GI: Abd soft, non-tender, gravid appropriate for gestational age. Cervix:  Dilation: 4.5 Effacement (%): 70 Cervical Position: Posterior Exam by:: Marvel Plan RN  FHT:  Baseline 145 , moderate variability, accelerations present, no decelerations Contractions: irregular with ui   Labs: No results found for this or any previous visit (from the past 24 hour(s)).  Imaging:  No results found.  MAU Course: Orders Placed This Encounter  Procedures  . Discharge patient Discharge disposition: 01-Home or Self Care; Discharge patient date: 02/13/2021   No orders of the defined types were placed in this encounter.   MDM: RN labor evaluation Not in active labor Reassurance provided to patient.  Follow up with OBGYN on Wednesday as scheduled. Return to MAU for any  emergencies.  Assessment: 1. Braxton Hick's contraction     Plan: Discharge home in stable condition.  Labor precautions and fetal kick counts  Follow-up Information    [facility], [place] Follow up.   Why: as scheduled Contact information: [address] [place] [address] [phone]               Allergies as of 02/13/2021   No Known Allergies     Medication List    TAKE these medications   cetirizine 10 MG tablet Commonly known as: ZYRTEC Take 10 mg by mouth daily.   multivitamin-prenatal 27-0.8 MG Tabs tablet Take 1 tablet by mouth daily at 12 noon.       Camelia Eng, CNM 02/13/2021 3:52 PM

## 2021-02-14 NOTE — Telephone Encounter (Signed)
Pt aware letter is available in MyChart.

## 2021-02-16 ENCOUNTER — Encounter: Payer: Self-pay | Admitting: Advanced Practice Midwife

## 2021-02-16 ENCOUNTER — Ambulatory Visit (INDEPENDENT_AMBULATORY_CARE_PROVIDER_SITE_OTHER): Payer: BC Managed Care – PPO | Admitting: Advanced Practice Midwife

## 2021-02-16 ENCOUNTER — Other Ambulatory Visit: Payer: Self-pay

## 2021-02-16 ENCOUNTER — Encounter: Payer: BC Managed Care – PPO | Admitting: Obstetrics and Gynecology

## 2021-02-16 VITALS — BP 116/70 | Ht 63.0 in | Wt 173.2 lb

## 2021-02-16 DIAGNOSIS — Z3483 Encounter for supervision of other normal pregnancy, third trimester: Secondary | ICD-10-CM

## 2021-02-16 DIAGNOSIS — Z3A39 39 weeks gestation of pregnancy: Secondary | ICD-10-CM

## 2021-02-16 NOTE — Progress Notes (Signed)
Routine Prenatal Care Visit  Subjective  Mandy Reeves is a 25 y.o. G2P1001 at [redacted]w[redacted]d being seen today for ongoing prenatal care.  She is currently monitored for the following issues for this low-risk pregnancy and has Current moderate episode of major depressive disorder without prior episode (HCC); GAD (generalized anxiety disorder); ADHD (attention deficit hyperactivity disorder), predominantly hyperactive impulsive type; Easy bruising; Cold intolerance; Bacterial vaginosis; Supervision of other normal pregnancy, antepartum; Panic attack; Labor and delivery indication for care or intervention; Labor and delivery, indication for care; and Abdominal pain affecting pregnancy on their problem list.  ----------------------------------------------------------------------------------- Patient reports she would like to be set up for an induction.   Contractions: Irregular. Vag. Bleeding: None.  Movement: Present. Leaking Fluid denies.  ----------------------------------------------------------------------------------- The following portions of the patient's history were reviewed and updated as appropriate: allergies, current medications, past family history, past medical history, past social history, past surgical history and problem list. Problem list updated.  Objective  Blood pressure 116/70, height 5\' 3"  (1.6 m), weight 173 lb 3.2 oz (78.6 kg), last menstrual period 05/20/2020. Pregravid weight 136 lb (61.7 kg) Total Weight Gain 37 lb 3.2 oz (16.9 kg) Urinalysis: Urine Protein    Urine Glucose    Fetal Status: Fetal Heart Rate (bpm): 143 Fundal Height: 35 cm Movement: Present  Presentation: Vertex  General:  Alert, oriented and cooperative. Patient is in no acute distress.  Skin: Skin is warm and dry. No rash noted.   Cardiovascular: Normal heart rate noted  Respiratory: Normal respiratory effort, no problems with respiration noted  Abdomen: Soft, gravid, appropriate for gestational age.  Pain/Pressure: Present     Pelvic:  Cervical exam performed Dilation: 5 Effacement (%): 70 Station: -2, cervical sweep  Extremities: Normal range of motion.  Edema: None  Mental Status: Normal mood and affect. Normal behavior. Normal judgment and thought content.   Assessment   25 y.o. G2P1001 at [redacted]w[redacted]d by  02/21/2021, by Ultrasound presenting for routine prenatal visit  Plan   Pregnancy#2 Problems (from 05/20/20 to present)    Problem Noted Resolved   Abdominal pain affecting pregnancy 02/10/2021 by 04/12/2021, MD No   Supervision of other normal pregnancy, antepartum 07/14/2020 by 09/13/2020, MD No   Overview Addendum 02/02/2021  4:16 PM by 02/04/2021, CNM    [facility]inic [facility] Prenatal Labs  Dating 9 wk u/s Blood type: O/Positive/-- (09/08 1204)   Genetic Screen declines Antibody:Negative (09/08 1204)  Anatomic 06-23-2000 Complete with R pelviectasis - f/u at 32 weeks normal Rubella: 1.28 (09/08 1204) Varicella: immune  GTT  Third trimester: 98 RPR: Non Reactive (09/08 1204)   Rhogam  n/a HBsAg: Negative (09/08 1204)   TDaP vaccine  Declined                    Flu Shot: Declines COVID: unvaccinated, prior covid in 2021 HIV: Non Reactive (09/08 1204)   Baby Food  Breast                               GBS: negative  Contraception  pillls Pap: NILM  CBB     CS/VBAC  hx vaginal delivery   Support Person  mother08-18-2001           Previous Version   GAD (generalized anxiety disorder) 01/27/2013 by 01/29/2013, MD No   Overview Signed 08/09/2020 11:11 AM by 10/09/2020, CNM  Started on Lexapro trial 10/4  Lengthy reported hx of panic attacks.          Term labor symptoms and general obstetric precautions including but not limited to vaginal bleeding, contractions, leaking of fluid and fetal movement were reviewed in detail with the patient. Please refer to After Visit Summary for other counseling recommendations.   Return for iol.  IOL 02/17/2021 at 8  am  [place] Mall, [place] 02/16/2021 4:02 PM

## 2021-02-16 NOTE — H&P (Deleted)
  The note originally documented on this encounter has been moved the the encounter in which it belongs.  

## 2021-02-16 NOTE — H&P (Signed)
OB History & Physical   History of Present Illness:  Chief Complaint: IOL  HPI:  Mandy Reeves is a 25 y.o. G57P1001 female at [redacted]w[redacted]d dated by 9 week ultrasound.  Her pregnancy has been complicated by anxiety, ADHD.    She reports occasional contractions.   She denies leakage of fluid except for loss of mucous plug a few days ago.   She denies vaginal bleeding.   She reports fetal movement.    Total weight gain for pregnancy: 37 lb 3.2 oz (16.9 kg)   Obstetrical Problem List: Pregnancy#2 Problems (from 05/20/20 to present)    Problem Noted Resolved   Abdominal pain affecting pregnancy 02/10/2021 by Nadara Mustard, MD No   Supervision of other normal pregnancy, antepartum 07/14/2020 by Conard Novak, MD No   Overview Addendum 02/02/2021  4:16 PM by Mirna Mires, CNM    [facility]inic [facility] Prenatal Labs  Dating 9 wk u/s Blood type: O/Positive/-- (09/08 1204)   Genetic Screen declines Antibody:Negative (09/08 1204)  Anatomic [place] Complete with R pelviectasis - f/u at 32 weeks normal Rubella: 1.28 (09/08 1204) Varicella: immune  GTT  Third trimester: 98 RPR: Non Reactive (09/08 1204)   Rhogam  n/a HBsAg: Negative (09/08 1204)   TDaP vaccine  Declined                    Flu Shot: Declines COVID: unvaccinated, prior covid in 2021 HIV: Non Reactive (09/08 1204)   Baby Food  Breast                               GBS: negative  Contraception  pillls Pap: NILM  CBB     CS/VBAC  hx vaginal delivery   Support Person  [name]           Previous Version   GAD (generalized anxiety disorder) 01/27/2013 by Chauncey Mann, MD No   Overview Signed 08/09/2020 11:11 AM by Mirna Mires, CNM    Started on Lexapro trial 10/4  Lengthy reported hx of panic attacks.          Maternal Medical History:   Past Medical History:  Diagnosis Date  . Anxiety   . Closed nondisplaced fracture of medial malleolus of left tibia 04/20/2017  . Depression   . Menorrhagia   . Orthostatic  hypotension   . Sprain of left ankle 04/23/2017    Past Surgical History:  Procedure Laterality Date  . TONSILLECTOMY AND ADENOIDECTOMY  05/2013   at age 54  . WISDOM TOOTH EXTRACTION  02/2020    No Known Allergies  Prior to Admission medications   Medication Sig Start Date End Date Taking? Authorizing Provider  cetirizine (ZYRTEC) 10 MG tablet Take 10 mg by mouth daily.   Yes [provider]  Prenatal Vit-Fe Fumarate-FA (MULTIVITAMIN-PRENATAL) 27-0.8 MG TABS tablet Take 1 tablet by mouth daily at 12 noon.   Yes [provider]    OB History  Gravida Para Term Preterm AB Living  2 1 1  0 0 1  SAB IAB Ectopic Multiple Live Births  0 0 0 0 1    # Outcome Date GA Lbr Len/2nd Weight Sex Delivery Anes PTL Lv  2 Current           1 Term 02/02/16 [redacted]w[redacted]d 04:56 / 00:14 6 lb 7 oz (2.92 kg) F Vag-Spont Local, Other  LIV    Prenatal  care site: [facility]  Social History: She  reports that she has quit smoking. Her smoking use included cigarettes. She has a 10.00 pack-year smoking history. She has never used smokeless tobacco. She reports that she does not drink alcohol and does not use drugs.  Family History: family history includes Cancer (age of onset: 22) in an other family member; Cancer (age of onset: 71) in her maternal grandfather; Cervical cancer in an other family member; Diabetes in her paternal grandfather; Endometriosis in an other family member; Hypertension in her father, maternal grandfather, and mother; Ovarian cancer in her paternal grandmother and another family member; Stroke in her father.    Review of Systems:  Review of Systems  Constitutional: Negative for chills and fever.  HENT: Negative for congestion, ear discharge, ear pain, hearing loss, sinus pain and sore throat.   Eyes: Negative for blurred vision and double vision.  Respiratory: Negative for cough, shortness of breath and wheezing.   Cardiovascular: Negative for chest pain,  palpitations and leg swelling.  Gastrointestinal: Negative for abdominal pain, blood in stool, constipation, diarrhea, heartburn, melena, nausea and vomiting.  Genitourinary: Negative for dysuria, flank pain, frequency, hematuria and urgency.  Musculoskeletal: Negative for back pain, joint pain and myalgias.  Skin: Negative for itching and rash.  Neurological: Negative for dizziness, tingling, tremors, sensory change, speech change, focal weakness, seizures, loss of consciousness, weakness and headaches.  Endo/Heme/Allergies: Negative for environmental allergies. Does not bruise/bleed easily.  Psychiatric/Behavioral: Negative for depression, hallucinations, memory loss, substance abuse and suicidal ideas. The patient is not nervous/anxious and does not have insomnia.      Physical Exam:  BP 116/70   Ht 5\' 3"  (1.6 m)   Wt 173 lb 3.2 oz (78.6 kg)   LMP 05/20/2020 (Approximate)   BMI 30.68 kg/m   OBGyn Exam    No results found for: SARSCOV2NAA] Covid test pending  Assessment:  Mandy Reeves is a 25 y.o. G42P1001 female at [redacted]w[redacted]d with elective induction of labor.   Plan:  1. Admit to Labor & Delivery  2. CBC, T&S, Clrs, IVF 3. GBS negative.   4. Fetal well-being: reassuring 5. Begin induction based on admission exam; consider AROM, pitocin as needed  [redacted]w[redacted]d, CNM 02/16/2021 4:09 PM

## 2021-02-17 ENCOUNTER — Inpatient Hospital Stay
Admission: EM | Admit: 2021-02-17 | Discharge: 2021-02-18 | DRG: 807 | Disposition: A | Discharge status: Home / Self Care | Payer: No Typology Code available for payment source | Attending: Obstetrics and Gynecology | Admitting: Obstetrics and Gynecology

## 2021-02-17 ENCOUNTER — Encounter: Payer: Self-pay | Admitting: Advanced Practice Midwife

## 2021-02-17 ENCOUNTER — Other Ambulatory Visit: Payer: Self-pay

## 2021-02-17 DIAGNOSIS — Z3A39 39 weeks gestation of pregnancy: Secondary | ICD-10-CM

## 2021-02-17 DIAGNOSIS — O26899 Other specified pregnancy related conditions, unspecified trimester: Secondary | ICD-10-CM

## 2021-02-17 DIAGNOSIS — F411 Generalized anxiety disorder: Secondary | ICD-10-CM

## 2021-02-17 DIAGNOSIS — Z3480 Encounter for supervision of other normal pregnancy, unspecified trimester: Secondary | ICD-10-CM

## 2021-02-17 DIAGNOSIS — Z23 Encounter for immunization: Secondary | ICD-10-CM | POA: Diagnosis not present

## 2021-02-17 DIAGNOSIS — Z20822 Contact with and (suspected) exposure to covid-19: Secondary | ICD-10-CM | POA: Diagnosis not present

## 2021-02-17 DIAGNOSIS — Z3490 Encounter for supervision of normal pregnancy, unspecified, unspecified trimester: Secondary | ICD-10-CM | POA: Diagnosis present

## 2021-02-17 DIAGNOSIS — O26893 Other specified pregnancy related conditions, third trimester: Secondary | ICD-10-CM | POA: Diagnosis not present

## 2021-02-17 DIAGNOSIS — Z309 Encounter for contraceptive management, unspecified: Secondary | ICD-10-CM | POA: Diagnosis present

## 2021-02-17 LAB — CBC
HCT: 35.3 % — ABNORMAL LOW (ref 36.0–46.0)
Hemoglobin: 12.7 g/dL (ref 12.0–15.0)
MCH: 32.2 pg (ref 26.0–34.0)
MCHC: 36 g/dL (ref 30.0–36.0)
MCV: 89.6 fL (ref 80.0–100.0)
Platelets: 199 10*3/uL (ref 150–400)
RBC: 3.94 MIL/uL (ref 3.87–5.11)
RDW: 12.7 % (ref 11.5–15.5)
WBC: 9.5 10*3/uL (ref 4.0–10.5)
nRBC: 0 % (ref 0.0–0.2)

## 2021-02-17 LAB — RESP PANEL BY RT-PCR (FLU A&B, COVID) ARPGX2
Influenza A by PCR: NEGATIVE
Influenza B by PCR: NEGATIVE
SARS Coronavirus 2 by RT PCR: NEGATIVE

## 2021-02-17 LAB — TYPE AND SCREEN
ABO/RH(D): O POS
Antibody Screen: NEGATIVE

## 2021-02-17 MED ORDER — ONDANSETRON HCL 4 MG PO TABS: 4.0000 mg | ORAL_TABLET | ORAL | Status: DC | PRN

## 2021-02-17 MED ORDER — ONDANSETRON HCL 4 MG/2ML IJ SOLN: 4.0000 mg | Freq: Four times a day (QID) | INTRAMUSCULAR | Status: DC | PRN

## 2021-02-17 MED ORDER — TERBUTALINE SULFATE 1 MG/ML IJ SOLN: 0.2500 mg | Freq: Once | INTRAMUSCULAR | Status: DC | PRN

## 2021-02-17 MED ORDER — OXYTOCIN-SODIUM CHLORIDE 30-0.9 UT/500ML-% IV SOLN
INTRAVENOUS | Status: AC
  Filled 2021-02-17: qty 500

## 2021-02-17 MED ORDER — BENZOCAINE-MENTHOL 20-0.5 % EX AERO: 1.0000 "application " | INHALATION_SPRAY | CUTANEOUS | Status: DC | PRN

## 2021-02-17 MED ORDER — BUTORPHANOL TARTRATE 1 MG/ML IJ SOLN
1.0000 mg | INTRAMUSCULAR | Status: DC | PRN
  Administered 2021-02-17: 1 mg via INTRAVENOUS
  Filled 2021-02-17: qty 1

## 2021-02-17 MED ORDER — ONDANSETRON HCL 4 MG/2ML IJ SOLN: 4.0000 mg | INTRAMUSCULAR | Status: DC | PRN

## 2021-02-17 MED ORDER — MISOPROSTOL 200 MCG PO TABS
ORAL_TABLET | ORAL | Status: AC
  Filled 2021-02-17: qty 4

## 2021-02-17 MED ORDER — IBUPROFEN 600 MG PO TABS
600.0000 mg | ORAL_TABLET | Freq: Four times a day (QID) | ORAL | Status: DC
  Administered 2021-02-17 – 2021-02-18 (×5): 600 mg via ORAL
  Filled 2021-02-17 (×5): qty 1

## 2021-02-17 MED ORDER — OXYTOCIN 10 UNIT/ML IJ SOLN
INTRAMUSCULAR | Status: AC
  Filled 2021-02-17: qty 2

## 2021-02-17 MED ORDER — AMMONIA AROMATIC IN INHA
RESPIRATORY_TRACT | Status: AC
  Filled 2021-02-17: qty 10

## 2021-02-17 MED ORDER — ACETAMINOPHEN 325 MG PO TABS: 650.0000 mg | ORAL_TABLET | ORAL | Status: DC | PRN

## 2021-02-17 MED ORDER — WITCH HAZEL-GLYCERIN EX PADS: 1.0000 "application " | MEDICATED_PAD | CUTANEOUS | Status: DC | PRN

## 2021-02-17 MED ORDER — COCONUT OIL OIL
1.0000 "application " | TOPICAL_OIL | Status: DC | PRN
  Filled 2021-02-17: qty 120

## 2021-02-17 MED ORDER — OXYTOCIN BOLUS FROM INFUSION
333.0000 mL | Freq: Once | INTRAVENOUS | Status: AC
  Administered 2021-02-17: 333 mL via INTRAVENOUS

## 2021-02-17 MED ORDER — DIBUCAINE (PERIANAL) 1 % EX OINT: 1.0000 "application " | TOPICAL_OINTMENT | CUTANEOUS | Status: DC | PRN

## 2021-02-17 MED ORDER — ZOLPIDEM TARTRATE 5 MG PO TABS: 5.0000 mg | ORAL_TABLET | Freq: Every evening | ORAL | Status: DC | PRN

## 2021-02-17 MED ORDER — LIDOCAINE HCL (PF) 1 % IJ SOLN: 30.0000 mL | INTRAMUSCULAR | Status: DC | PRN

## 2021-02-17 MED ORDER — DOCUSATE SODIUM 100 MG PO CAPS
100.0000 mg | ORAL_CAPSULE | Freq: Two times a day (BID) | ORAL | Status: DC
  Administered 2021-02-18: 100 mg via ORAL
  Filled 2021-02-17: qty 1

## 2021-02-17 MED ORDER — SIMETHICONE 80 MG PO CHEW
80.0000 mg | CHEWABLE_TABLET | ORAL | Status: DC | PRN
Start: 2021-02-17 — End: 2021-02-18

## 2021-02-17 MED ORDER — LACTATED RINGERS IV SOLN: INTRAVENOUS | Status: DC

## 2021-02-17 MED ORDER — OXYTOCIN-SODIUM CHLORIDE 30-0.9 UT/500ML-% IV SOLN: 2.5000 [IU]/h | INTRAVENOUS | Status: DC

## 2021-02-17 MED ORDER — OXYCODONE HCL 5 MG PO TABS: 5.0000 mg | ORAL_TABLET | ORAL | Status: DC | PRN

## 2021-02-17 MED ORDER — LACTATED RINGERS IV SOLN: 500.0000 mL | INTRAVENOUS | Status: DC | PRN

## 2021-02-17 MED ORDER — LIDOCAINE HCL (PF) 1 % IJ SOLN
INTRAMUSCULAR | Status: AC
  Filled 2021-02-17: qty 30

## 2021-02-17 MED ORDER — OXYTOCIN-SODIUM CHLORIDE 30-0.9 UT/500ML-% IV SOLN: 1.0000 m[IU]/min | INTRAVENOUS | Status: DC

## 2021-02-17 MED ORDER — DIPHENHYDRAMINE HCL 25 MG PO CAPS: 25.0000 mg | ORAL_CAPSULE | Freq: Four times a day (QID) | ORAL | Status: DC | PRN

## 2021-02-17 MED ORDER — TETANUS-DIPHTH-ACELL PERTUSSIS 5-2.5-18.5 LF-MCG/0.5 IM SUSY: 0.5000 mL | PREFILLED_SYRINGE | Freq: Once | INTRAMUSCULAR | Status: DC

## 2021-02-17 MED ORDER — PRENATAL MULTIVITAMIN CH
1.0000 | ORAL_TABLET | Freq: Every day | ORAL | Status: DC
  Administered 2021-02-18: 1 via ORAL
  Filled 2021-02-17: qty 1

## 2021-02-17 NOTE — Discharge Summary (Signed)
Physician Discharge Summary  Patient ID: Mandy Reeves MRN: [id number] DOB/AGE: 01-Dec-1995 25 y.o.  Admit date: 02/17/2021 Discharge date: 02/18/2021  Admission Diagnoses:Term pregnancy for Induction of labor  Discharge Diagnoses:  Principal Problem:   Encounter for elective induction of labor Active Problems:   Postpartum care following vaginal delivery   Normal labor and delivery   Discharged Condition: good  Hospital Course: The patient was admitted for elective induction at term she was 5 cms dilated upon admission. Once admitted, AROM was performed, and she quickly advanced into active labor and delivered vaginally in several hours.Throughout her labor, the FHTs were reactive and reassuring. Vaginal delivery over and intact perineum. Her postpartum course is routine, and she is discharged home after several days in excellent condition. She will follow up in 2 and 6 weeks at [facility].  Consults: None  Significant Diagnostic Studies: labs:  Treatments: none additiaional  Discharge Exam: Blood pressure 111/77, pulse 64, temperature 97.9 F (36.6 C), temperature source Oral, resp. rate 18, height 5\' 3"  (1.6 m), weight 78.5 kg, last menstrual period 05/20/2020, SpO2 100 %, unknown if currently breastfeeding. General appearance: alert, cooperative and no distress Neck: no adenopathy, no carotid bruit, no JVD, supple, symmetrical, trachea midline and thyroid not enlarged, symmetric, no tenderness/mass/nodules Resp: clear to auscultation bilaterally Cardio: regular rate and rhythm, S1, S2 normal, no murmur, click, rub or gallop GI: soft, non-tender; bowel sounds normal; no masses,  no organomegaly and fundus firm Pelvic: normal lcohia Extremities: extremities normal, atraumatic, no cyanosis or edema and Homans sign is negative, no sign of DVT Pulses: 2+ and symmetric Skin: Skin color, texture, turgor normal. No rashes or lesions  Disposition:  Discharge disposition:  01-Home or Self Care       Discharge Instructions    Call MD for:  difficulty breathing, headache or visual disturbances   Complete by: As directed    Call MD for:  severe uncontrolled pain   Complete by: As directed    Call MD for:  temperature >100.4   Complete by: As directed    Diet - low sodium heart healthy   Complete by: As directed    Increase activity slowly   Complete by: As directed    May shower / Bathe   Complete by: As directed    No wound care   Complete by: As directed    Sexual Activity Restrictions   Complete by: As directed    No sex for 6 weeks postpartum     Allergies as of 02/18/2021   No Known Allergies     Medication List    TAKE these medications   cetirizine 10 MG tablet Commonly known as: ZYRTEC Take 10 mg by mouth daily.   multivitamin-prenatal 27-0.8 MG Tabs tablet Take 1 tablet by mouth daily at 12 noon.   norethindrone 0.35 MG tablet Commonly known as: MICRONOR Take 1 tablet (0.35 mg total) by mouth daily. Start in 2-3 weeks       Follow-up Information    02/20/2021, CNM Follow up in 2 week(s).   Specialties: Obstetrics, Gynecology Why: Please call the [facility] office and make an appointment for botha 2 week postpartum and a 6 week postpartum visit. If you think you may get an IUD or the Nexplanon, let the office know and this can be done at your 6 week PP appointment. Contact information: [address] [place] [employer] [place] [phone]  Signed: Letitia Libra 02/18/2021, 8:55 AM

## 2021-02-17 NOTE — Lactation Note (Signed)
This note was copied from a baby's chart. Lactation Consultation Note  Patient Name: Mandy Reeves Date: 02/17/2021 Reason for consult: Initial assessment;Term;Nipple pain/trauma Age:25 hours  LC's to room for initial consult. P2 Mom reports that she was feeling nipple soreness. Baby was sleeping at time of entry; however once unswaddled, showed hunger cues. [facility] student assisted mother with latching in [place] hold on the right breast. Baby had a shallow latch and was slipping off of the breast, so LC student recommended latching in football hold. After two attempts, baby was able to latch onto breast. Mom reported that she felt comfortable and felt no pain. Strong audible sucks and swallows were noted. [facility] student taught hand expression on the left breast while baby fed on the right breast.  Drops of colostrum were noted. The expressed milk was rubbed into her nipple to help with healing on the left breast. LC student educated mother on the first 24 hours; sleepiness, feeding cues, normal output, breast milk intake per feeding, and cluster feeding after 24 hours. No further questions asked; LC's informed mother of informational packet. Mother is aware of lactation services and will page as needed before follow up consultation.   Feeding plan: 1. Skin to skin 2. Feed baby on demand 8-12 times a day in football hold 3. Hand express before feedings 4. Use colostrum for healing  Maternal Data Has patient been taught Hand Expression?: Yes Does the patient have breastfeeding experience prior to this delivery?: Yes How long did the patient breastfeed?: 6 months  Feeding Mother's Current Feeding Choice: Breast Milk  LATCH Score Latch: Repeated attempts needed to sustain latch, nipple held in mouth throughout feeding, stimulation needed to elicit sucking reflex.  Audible Swallowing: Spontaneous and intermittent  Type of Nipple: Everted at rest and after stimulation  Comfort  (Breast/Nipple): Filling, red/small blisters or bruises, mild/mod discomfort  Hold (Positioning): Assistance needed to correctly position infant at breast and maintain latch.  LATCH Score: 7   Lactation Tools Discussed/Used    Interventions Interventions: Breast feeding basics reviewed;Assisted with latch;Skin to skin;Breast massage;Hand express;Support pillows;Position options;Education  Discharge WIC Program: Yes  Consult Status Consult Status: Follow-up Date: 02/18/21    Cay Schillings 02/17/2021, 3:34 PM

## 2021-02-17 NOTE — Progress Notes (Signed)
Mandy Reeves is a 25 y.o. [id number] at [redacted]w[redacted]d by ultrasound admitted for induction of labor due to Elective at term. She was seen in the office yesterday and found to be 5 cms dilated. She requested an induction, and has birth desires that include fewer SVEs.  Her parents are serving as her support team.   Subjective: she admits to having some contractions over night after having a cervical sweep yesterday. She denies apinful contractions, denies LOF, and her baby has been moving well.   Objective: BP 138/89   Pulse 93   Temp 97.7 F (36.5 C) (Oral)   Resp 18   Ht 5\' 3"  (1.6 m)   Wt 78.5 kg   LMP 05/20/2020 (Approximate)   Breastfeeding Unknown   BMI 30.65 kg/m  No intake/output data recorded. Total I/O In: -  Out: 100 [Blood:100]  FHT:  FHR: 145 bpm, variability: moderate,  accelerations:  Present,  decelerations:  Absent UC:   irregular, every 36 minutes SVE:   Dilation: 4.5 Effacement (%): 90 Station: -2 Exam by:: [email or web address], CNM  Labs: Lab Results  Component Value Date   WBC 9.5 02/17/2021   HGB 12.7 02/17/2021   HCT 35.3 (L) 02/17/2021   MCV 89.6 02/17/2021   PLT 199 02/17/2021    Assessment / Plan: Induction of labor due to term with favorable cervix,  progressing well on pitocin  Labor: will commence her IOL with AROM- performed at 0852, and clear fluid seen.  Fetal Wellbeing:  Category I Pain Control:  Labor support without medications I/D:  n/a Anticipated MOD:  NSVD   Anticipate rapid progress.  02/19/2021, CNM  02/17/2021 11:21 AM    02/19/2021 Claris Che 02/17/2021, 11:11 AM

## 2021-02-18 LAB — CBC
HCT: 31.3 % — ABNORMAL LOW (ref 36.0–46.0)
Hemoglobin: 11.1 g/dL — ABNORMAL LOW (ref 12.0–15.0)
MCH: 32.4 pg (ref 26.0–34.0)
MCHC: 35.5 g/dL (ref 30.0–36.0)
MCV: 91.3 fL (ref 80.0–100.0)
Platelets: 167 10*3/uL (ref 150–400)
RBC: 3.43 MIL/uL — ABNORMAL LOW (ref 3.87–5.11)
RDW: 13 % (ref 11.5–15.5)
WBC: 9.6 10*3/uL (ref 4.0–10.5)
nRBC: 0 % (ref 0.0–0.2)

## 2021-02-18 LAB — RPR: RPR Ser Ql: NONREACTIVE

## 2021-02-18 MED ORDER — NORETHINDRONE 0.35 MG PO TABS: 1.0000 | ORAL_TABLET | Freq: Every day | ORAL | 11 refills | Status: DC

## 2021-02-18 NOTE — Progress Notes (Signed)
Patient discharged home with infant. Discharge instructions, prescriptions and follow up appointment given to and reviewed with patient. Patient verbalized understanding. Pt wheeled out with infant by auxiliary.  

## 2021-02-18 NOTE — Clinical Social Work Maternal (Signed)
CLINICAL SOCIAL WORK MATERNAL/CHILD NOTE  Patient Details  Name: Mandy Reeves MRN: [id number] Date of Birth: 1995/11/16  Date:  02/18/2021  Clinical Social Worker Initiating Note:  Oretha Ellis, LCSW Date/Time: Initiated:  02/18/21/1317     Child's Name:  Mandy Reeves   Biological Parents:  Mother   Need for Interpreter:  None   Reason for Referral:  Other (Comment) Flavia Shipper Postnatal Depression Scale results)   Address:  [place] [address]    Phone number:  [phone] (home)     Additional phone number: None  Household Members/Support Persons (HM/SP):   Household Member/Support Person 1,Household Member/Support Person 2,Household Member/Support Person 3   HM/SP Name Relationship DOB or Age  HM/SP -74 Wilfred Lacy Daughter 5  HM/SP -[place] Mother    HM/SP -3 [place] Step-Father    HM/SP -4        HM/SP -5        HM/SP -6        HM/SP -7        HM/SP -8          Natural Supports (not living in the home):  Immediate Family,Parent   Professional Supports: Case Manager/Social Worker   Employment: Full-time   Type of Work: Radiographer, therapeutic   Education:      Homebound arranged:    Museum/gallery curator Resources:  Medicaid   Other Resources:  [facility]   Cultural/Religious Considerations Which May Impact Care:  Not assessed  Strengths:  Ability to meet basic needs ,Home prepared for child ,Pediatrician chosen   Psychotropic Medications:         Pediatrician:    [employer]  Pediatrician List:   [place]      Pediatrician Fax Number: [phone]  Risk Factors/Current Problems:  None   Cognitive State:  Alert    Mood/Affect:  Calm    CSW Assessment: CSW received a consult for MOB's Edinburgh Postnatal Depression Scale results. CSW met with MOB, baby, and mother of MOB at bedside. Explained CSW's role and  reason for referral. MOB reported she is feeling good post delivery. MOB was alert, appropriate, and attentive to Baby during assessment. MOB reports she will breastfeed and baby is latching and eating well. Confirmed contact information for MOB. MOB and Baby will be living with daughter Mandy Reeves, age 25), maternal mother and step-father at discharge. Per MOB report, FOB is "not in the picture."    MOB reported she receives [facility] and will inform her Workers of 61 birth. MOB plans to use [facility] for [facility]. MOB reported she has a crib, bassinet, car seat (new), clothing, diapers, and all other items needed for Baby. MOB reported she has reliable transportation for herself and Baby. MOB denied resource needs at this time.    MOB reported she has a history of anxiety and depression. MOB has not taken medication for anxiety and depression or seen a therapist in about 5 years. MOB stated when she was on her medica. MOB stated the last few weeks were stressful due to having "lots of pain" and "just anticipating her being born." MOB states she feel better "now that she's here." MOB reported she has a good support system and is coping well emotionally at this time. MOB denied SI, HI, or DV. MOB denied the need for mental health support  resources at this time, reported she is aware of resources if needed.   CSW provided education and information sheets on PPD and SIDS. MOB verbalized understanding. CSW encouraged MOB to reach out to her Provider with any questions or needs for support or resources, even after discharge. MOB denied any needs or questions at this time. CSW encouraged MOB to reach out if any arise prior to discharge. CSW updated RN Raquel Sarna (via secure chat) after assessing MOB. Please re consult CSW if any additional needs or concerns arise.  CSW Plan/Description:  No Further Intervention Required/No Barriers to Discharge,Sudden Infant Death Syndrome (SIDS) Education    Truitt Merle, LCSW 01/27/2021, 2:30 PM

## 2021-02-18 NOTE — Discharge Instructions (Signed)

## 2021-02-18 NOTE — Lactation Note (Signed)
This note was copied from a baby's chart. Lactation Consultation Note  Patient Name: Mandy Reeves [id number] Date: 02/18/2021 Reason for consult: Follow-up assessment;Term Age:25 hours  Maternal Data Has patient been taught Hand Expression?: Yes Does the patient have breastfeeding experience prior to this delivery?: Yes How long did the patient breastfeed?: 6 mths Red and tender nipples, no cracks, previous hx of mastitis at 6 mths Feeding Mother's Current Feeding Choice: Breast Milk Mom assisted with latching baby to left breast which is less sore than right, in side lying position to change area on nipple that baby is latching to, baby latches easily and mom shown how to shape breast to get deep latch and prevent unncessary pulling on nipple with baby's tummy turned to mom's tummy, lots of swallows noted with no nipple pain for mom in this position.  Encouraged breastfeeding on both breasts each feeding if possible and frequent feeds to decrease chance of mastitis.    LATCH Score Latch: Grasps breast easily, tongue down, lips flanged, rhythmical sucking.  Audible Swallowing: Spontaneous and intermittent  Type of Nipple: Everted at rest and after stimulation  Comfort (Breast/Nipple): Filling, red/small blisters or bruises, mild/mod discomfort  Hold (Positioning): Assistance needed to correctly position infant at breast and maintain latch.  LATCH Score: 8   Lactation Tools Discussed/Used  LC name and no written on white board  Interventions Interventions: Assisted with latch;Breast feeding basics reviewed;Skin to skin;Hand express;Adjust position;Coconut oil  Discharge Discharge Education: Engorgement and breast care;Warning signs for feeding baby Pump: Personal WIC Program: No  Consult Status Consult Status: Complete Date: 02/18/21 Follow-up type: In-patient    Dyann Kief 02/18/2021, 12:22 PM

## 2021-02-23 ENCOUNTER — Encounter: Payer: BC Managed Care – PPO | Admitting: Obstetrics and Gynecology

## 2021-03-03 ENCOUNTER — Ambulatory Visit (INDEPENDENT_AMBULATORY_CARE_PROVIDER_SITE_OTHER): Payer: BC Managed Care – PPO | Admitting: Obstetrics

## 2021-03-03 ENCOUNTER — Encounter: Payer: Self-pay | Admitting: Obstetrics

## 2021-03-03 ENCOUNTER — Other Ambulatory Visit: Payer: Self-pay

## 2021-03-03 DIAGNOSIS — F419 Anxiety disorder, unspecified: Secondary | ICD-10-CM

## 2021-03-03 DIAGNOSIS — K640 First degree hemorrhoids: Secondary | ICD-10-CM

## 2021-03-03 DIAGNOSIS — F32A Depression, unspecified: Secondary | ICD-10-CM

## 2021-03-03 MED ORDER — ESCITALOPRAM OXALATE 10 MG PO TABS: 10.0000 mg | ORAL_TABLET | Freq: Every day | ORAL | 3 refills | Status: DC

## 2021-03-03 MED ORDER — HYDROCORT-PRAMOXINE (PERIANAL) 1-1 % EX FOAM: 1.0000 | Freq: Two times a day (BID) | CUTANEOUS | Status: DC

## 2021-03-03 NOTE — Progress Notes (Signed)
Obstetrics & Gynecology Office Visit   Chief Complaint:  Chief Complaint  Patient presents with  . Postpartum Care    No complaints    History of Present Illness: Mandy Reeves presents at 2 weeks postpatrum for a mood check. She has a history of mixed anxiety and depressive symptoms, and has ot been on medication postpartally.  Today she brings her baby with her. Mandy Reeves reports that she continues to nurse , but this has been a challenge and her nippples have been scabbed and had stripes until recently.  She has not been back to see lactation. Her baby is thriving, and now weighs 9 lbs.   She delivered over an intact perineum and denies any physical pain or heavy bleeding.\ She is amenable to discussing SSRI medication.   Review of Systems:  Review of Systems  Constitutional: Negative.   HENT: Negative.   Eyes: Negative.   Respiratory: Negative.   Cardiovascular: Negative.   Gastrointestinal:       Hemorroids  Genitourinary: Negative.   Musculoskeletal: Negative.   Skin: Negative.   Neurological: Negative.   Endo/Heme/Allergies: Negative.   Psychiatric/Behavioral: Positive for depression. The patient is nervous/anxious.        Edinburgh score is 11 today     Past Medical History:  Past Medical History:  Diagnosis Date  . Anxiety   . Closed nondisplaced fracture of medial malleolus of left tibia 04/20/2017  . Depression   . Menorrhagia   . Orthostatic hypotension   . Sprain of left ankle 04/23/2017    Past Surgical History:  Past Surgical History:  Procedure Laterality Date  . TONSILLECTOMY AND ADENOIDECTOMY  05/2013   at age 25  . WISDOM TOOTH EXTRACTION  02/2020    Gynecologic History: Patient's last menstrual period was 02/15/2021.  Obstetric History: A4T3646  Family History:  Family History  Problem Relation Age of Onset  . Hypertension Father   . Stroke Father   . Cancer Maternal Grandfather 57       kidney  . Hypertension Maternal Grandfather   .  Ovarian cancer Paternal Grandmother   . Hypertension Mother   . Diabetes Paternal Grandfather   . Cancer Other 65       BREAST  . Endometriosis Other   . Ovarian cancer Other   . Cervical cancer Other     Social History:  Social History   Socioeconomic History  . Marital status: Single    Spouse name: Not on file  . Number of children: 1  . Years of education: Not on file  . Highest education level: Not on file  Occupational History  . Occupation: RETAIL Investment banker, corporate: [id number]  Tobacco Use  . Smoking status: Former Smoker    Packs/day: 1.00    Years: 10.00    Pack years: 10.00    Types: Cigarettes  . Smokeless tobacco: Never Used  . Tobacco comment: Quit 2019; Started at age 71  Vaping Use  . Vaping Use: Every day  . Last attempt to quit: 07/07/2018  . Substances: CBD  Substance and Sexual Activity  . Alcohol use: No    Comment: 3-4 mixed drinks per week prior to pregnancy  . Drug use: No  . Sexual activity: Yes    Birth control/protection: None    Comment: possible pill   Other Topics Concern  . Not on file  Social History Narrative   Has 3yo daughter; lives with her mother and stepfather and her daughter.  Social Determinants of Health   Financial Resource Strain: Not on file  Food Insecurity: Not on file  Transportation Needs: Not on file  Physical Activity: Not on file  Stress: Not on file  Social Connections: Not on file  Intimate Partner Violence: Not on file    Allergies:  No Known Allergies  Medications: Prior to Admission medications   Medication Sig Start Date End Date Taking? Authorizing Provider  cetirizine (ZYRTEC) 10 MG tablet Take 10 mg by mouth daily.   Yes [provider]  escitalopram (LEXAPRO) 10 MG tablet Take 1 tablet (10 mg total) by mouth daily. 03/03/21  Yes Mirna Mires, CNM  Prenatal Vit-Fe Fumarate-FA (MULTIVITAMIN-PRENATAL) 27-0.8 MG TABS tablet Take 1 tablet by mouth daily at 12 noon.   Yes [provider]  norethindrone (MICRONOR) 0.35 MG tablet Take 1 tablet (0.35 mg total) by mouth daily. Start in 2-3 weeks Patient not taking: Reported on 03/03/2021 02/18/21   Nadara Mustard, MD    Physical Exam Vitals:  Vitals:   03/03/21 1102  BP: (!) 110/56  Pulse: 94   Patient's last menstrual period was 02/15/2021.  Physical Exam Constitutional:      Appearance: Normal appearance.  HENT:     Head: Normocephalic and atraumatic.     Nose: Nose normal.  Cardiovascular:     Rate and Rhythm: Normal rate and regular rhythm.  Pulmonary:     Effort: Pulmonary effort is normal.     Breath sounds: Normal breath sounds.  Genitourinary:    Comments: deferred Musculoskeletal:        General: Normal range of motion.     Cervical back: Normal range of motion and neck supple.  Skin:    General: Skin is warm and dry.  Neurological:     General: No focal deficit present.     Mental Status: She is alert and oriented to person, place, and time.  Psychiatric:        Mood and Affect: Mood normal.        Behavior: Behavior normal.    Edinburgh score 11 today.  Assessment: 25 y.o. A6T0160  Two weeks postpartum  Mixed anxiety/depressive symptoms. Lactation cahllenges Hemorroids  Plan: Problem List Items Addressed This Visit      Other   Postpartum care following vaginal delivery - Primary   Relevant Medications   escitalopram (LEXAPRO) 10 MG tablet   hydrocortisone-pramoxine (PROCTOFOAM-HC) rectal foam 1 applicator (Start on 03/03/2021 12:30 PM)    Other Visit Diagnoses    Anxiety and depression       Relevant Medications   escitalopram (LEXAPRO) 10 MG tablet   Grade I hemorrhoids       Relevant Medications   hydrocortisone-pramoxine (PROCTOFOAM-HC) rectal foam 1 applicator (Start on 03/03/2021 12:30 PM)     WE discussed the option of starting on some SSRI meds. She is open to this. Lexapro 10 mg trial offered and accepted. Discussed the isks and benefits, when to take and  the import of taking daily and never quitting medication abruptly.  We will revisit how she is doing at her 6 week PP physical in 4 weeks.  Rx for Proctofoam for her hemorroids  Contracepetion discussed- she is leaning toward an IUD.  Mirna Mires, CNM  03/03/2021 12:28 PM

## 2021-03-31 ENCOUNTER — Encounter: Payer: Self-pay | Admitting: Obstetrics

## 2021-03-31 ENCOUNTER — Ambulatory Visit (INDEPENDENT_AMBULATORY_CARE_PROVIDER_SITE_OTHER): Payer: BC Managed Care – PPO | Admitting: Obstetrics

## 2021-03-31 ENCOUNTER — Other Ambulatory Visit: Payer: Self-pay

## 2021-03-31 DIAGNOSIS — Z30011 Encounter for initial prescription of contraceptive pills: Secondary | ICD-10-CM

## 2021-03-31 MED ORDER — NORETHINDRONE 0.35 MG PO TABS
1.0000 | ORAL_TABLET | Freq: Every day | ORAL | 1 refills | Status: DC
Start: 2021-03-31 — End: 2021-10-05

## 2021-03-31 NOTE — Progress Notes (Signed)
Postpartum Visit  Chief Complaint:  Chief Complaint  Patient presents with  . Postpartum Care    History of Present Illness: Patient is a 25 y.o. [id number] presents for postpartum visit.  Date of delivery: 02/17/2021 Type of delivery: Vaginal delivery - Vacuum or forceps assisted  no Episiotomy No.  Laceration: no  Pregnancy or labor problems:  no Any problems since the delivery:  no  Newborn Details:  SINGLETON :  1. Baby's name: baby girl. Birth weight:  Maternal Details:  Breast Feeding:  yes Post partum depression/anxiety noted:  no Edinburgh Post-Partum Depression Score:  6  Date of last PAP: 07/14/2020  normal   Past Medical History:  Diagnosis Date  . Anxiety   . Closed nondisplaced fracture of medial malleolus of left tibia 04/20/2017  . Depression   . Menorrhagia   . Orthostatic hypotension   . Sprain of left ankle 04/23/2017  . Supervision of other normal pregnancy, antepartum 07/14/2020   Clinic [facility] Prenatal Labs Dating 9 wk u/s Blood type: O/Positive/-- (09/08 1204)  Genetic Screen declines Antibody:Negative (09/08 1204) Anatomic [place] Complete with R pelviectasis - f/u at 32 weeks normal Rubella: 1.28 (09/08 1204) Varicella: immune GTT  Third trimester: 98 RPR: Non Reactive (09/08 1204)  Rhogam  n/a HBsAg: Negative (09/08 1204)  TDaP vaccine  Declined                    Flu Sho    Past Surgical History:  Procedure Laterality Date  . TONSILLECTOMY AND ADENOIDECTOMY  05/2013   at age 71  . WISDOM TOOTH EXTRACTION  02/2020    Prior to Admission medications   Medication Sig Start Date End Date Taking? Authorizing Provider  cetirizine (ZYRTEC) 10 MG tablet Take 10 mg by mouth daily.   Yes [provider]  escitalopram (LEXAPRO) 10 MG tablet Take 1 tablet (10 mg total) by mouth daily. 03/03/21  Yes Mirna Mires, CNM  Prenatal Vit-Fe Fumarate-FA (MULTIVITAMIN-PRENATAL) 27-0.8 MG TABS tablet Take 1 tablet by mouth daily at 12 noon.   Yes [provider]    No Known Allergies   Social History   Socioeconomic History  . Marital status: Single    Spouse name: Not on file  . Number of children: 1  . Years of education: Not on file  . Highest education level: Not on file  Occupational History  . Occupation: RETAIL Investment banker, corporate: [id number]  Tobacco Use  . Smoking status: Former Smoker    Packs/day: 1.00    Years: 10.00    Pack years: 10.00    Types: Cigarettes  . Smokeless tobacco: Never Used  . Tobacco comment: Quit 2019; Started at age 89  Vaping Use  . Vaping Use: Every day  . Last attempt to quit: 07/07/2018  . Substances: CBD  Substance and Sexual Activity  . Alcohol use: No    Comment: 3-4 mixed drinks per week prior to pregnancy  . Drug use: No  . Sexual activity: Yes    Birth control/protection: None    Comment: possible pill   Other Topics Concern  . Not on file  Social History Narrative   Has 3yo daughter; lives with her mother and stepfather and her daughter.   Social Determinants of Health   Financial Resource Strain: Not on file  Food Insecurity: Not on file  Transportation Needs: Not on file  Physical Activity: Not on file  Stress: Not on file  Social Connections:  Not on file  Intimate Partner Violence: Not on file    Family History  Problem Relation Age of Onset  . Hypertension Father   . Stroke Father   . Cancer Maternal Grandfather 77       kidney  . Hypertension Maternal Grandfather   . Ovarian cancer Paternal Grandmother   . Hypertension Mother   . Diabetes Paternal Grandfather   . Cancer Other 65       BREAST  . Endometriosis Other   . Ovarian cancer Other   . Cervical cancer Other     ROS   Physical Exam BP 120/70   Ht 5\' 3"  (1.6 m)   Wt 159 lb (72.1 kg)   BMI 28.17 kg/m   OBGyn Exam   Female Chaperone present during breast and/or pelvic exam.  Assessment: 25 y.o. 25 presenting for 6 week postpartum visit  Plan: Problem List Items Addressed This  Visit   None      1) Contraception Education given regarding options for contraception, including oral contraceptives.  2)  Pap - ASCCP guidelines and rational discussed.  Patient opts for annual screening interval  3) Patient underwent screening for postpartum depression with no concerns noted. She does have a hx of mood "irritability" and has a prescription for Lexapro, which she admits she has not filled yet. She is smiling and upbeat, and denies any depressive or anxious symptoms. She will hold onto the Lexapro script and RTC in 5 months for her annual GYN physical. We will revisit her mood issues then.  4) Follow up 5 months for routine annual exam  [id number], CNM  03/31/2021 12:29 PM   03/31/2021 10:32 AM

## 2021-04-06 DIAGNOSIS — M5489 Other dorsalgia: Secondary | ICD-10-CM | POA: Diagnosis not present

## 2021-09-05 ENCOUNTER — Other Ambulatory Visit: Payer: Self-pay

## 2021-09-05 ENCOUNTER — Ambulatory Visit: Payer: BC Managed Care – PPO | Admitting: Obstetrics

## 2021-09-23 ENCOUNTER — Ambulatory Visit: Payer: BC Managed Care – PPO | Admitting: Obstetrics

## 2021-10-05 ENCOUNTER — Ambulatory Visit (INDEPENDENT_AMBULATORY_CARE_PROVIDER_SITE_OTHER): Payer: BC Managed Care – PPO | Admitting: Obstetrics

## 2021-10-05 ENCOUNTER — Encounter: Payer: Self-pay | Admitting: Obstetrics

## 2021-10-05 ENCOUNTER — Other Ambulatory Visit: Payer: Self-pay

## 2021-10-05 ENCOUNTER — Other Ambulatory Visit (HOSPITAL_COMMUNITY)
Admission: RE | Admit: 2021-10-05 | Discharge: 2021-10-05 | Disposition: A | Discharge status: Home / Self Care | Payer: Medicaid Other | Source: Ambulatory Visit | Attending: Obstetrics | Admitting: Obstetrics

## 2021-10-05 VITALS — BP 122/70 | Ht 63.0 in | Wt 166.6 lb

## 2021-10-05 DIAGNOSIS — N898 Other specified noninflammatory disorders of vagina: Secondary | ICD-10-CM

## 2021-10-05 DIAGNOSIS — F32A Depression, unspecified: Secondary | ICD-10-CM

## 2021-10-05 DIAGNOSIS — Z01419 Encounter for gynecological examination (general) (routine) without abnormal findings: Secondary | ICD-10-CM | POA: Diagnosis not present

## 2021-10-05 DIAGNOSIS — Z113 Encounter for screening for infections with a predominantly sexual mode of transmission: Secondary | ICD-10-CM

## 2021-10-05 DIAGNOSIS — Z124 Encounter for screening for malignant neoplasm of cervix: Secondary | ICD-10-CM

## 2021-10-05 DIAGNOSIS — F419 Anxiety disorder, unspecified: Secondary | ICD-10-CM | POA: Diagnosis not present

## 2021-10-05 MED ORDER — ESCITALOPRAM OXALATE 10 MG PO TABS
10.0000 mg | ORAL_TABLET | Freq: Every day | ORAL | 3 refills | Status: DC
Start: 2021-10-05 — End: 2022-04-17

## 2021-10-05 NOTE — Progress Notes (Signed)
Gynecology Annual Exam  PCP: [name], FNP  Chief Complaint:  Chief Complaint  Patient presents with   Gynecologic Exam    History of Present Illness:  [name] is a 25 y.o. [phone] who LMP was No LMP recorded., presents today for her annual examination.  Her menses are regular every 28-30 days, lasting 5 day(s).  Dysmenorrhea mild, occurring first 1-2 days of flow. She does not have intermenstrual bleeding. She would like to discuss ongoing anxiety and mild depressive symptoms which she we had addressed at her PP visit. She was offered Lexapro trial, and admits today that she never started on the medication due to concern that she would "become dependent" on the medication. She has a hx of some previous substance use, and has thus tended to avoid using most medications.  She is not sexually active.  Last Pap: July 15, 2020  Results were: no abnormalities / Hx of STDs: chlamydia  There is no FH of breast cancer. There is no FH of ovarian cancer. The patient does do self-breast exams.  Tobacco use:  She vapes. Alcohol use: none Exercise: moderately active    The patient wears seatbelts: yes.   The patient reports that domestic violence in her life is absent.   Past Medical History:  Diagnosis Date   Anxiety    Closed nondisplaced fracture of medial malleolus of left tibia 04/20/2017   Depression    Menorrhagia    Orthostatic hypotension    Sprain of left ankle 04/23/2017   Supervision of other normal pregnancy, antepartum 07/14/2020   Clinic [facility] Prenatal Labs Dating 9 wk u/s Blood type: O/Positive/-- (09/08 1204)  Genetic Screen declines Antibody:Negative (09/08 1204) Anatomic [place] Complete with R pelviectasis - f/u at 32 weeks normal Rubella: 1.28 (09/08 1204) Varicella: immune GTT  Third trimester: 98 RPR: Non Reactive (09/08 1204)  Rhogam  n/a HBsAg: Negative (09/08 1204)  TDaP vaccine  Declined                    Flu Sho    Past Surgical History:   Procedure Laterality Date   TONSILLECTOMY AND ADENOIDECTOMY  05/2013   at age 31   WISDOM TOOTH EXTRACTION  02/2020    Prior to Admission medications   Medication Sig Start Date End Date Taking? Authorizing Provider  cetirizine (ZYRTEC) 10 MG tablet Take 10 mg by mouth daily.   Yes [provider]  Prenatal Vit-Fe Fumarate-FA (MULTIVITAMIN-PRENATAL) 27-0.8 MG TABS tablet Take 1 tablet by mouth daily at 12 noon.   Yes [provider]  escitalopram (LEXAPRO) 10 MG tablet Take 1 tablet (10 mg total) by mouth daily. Patient not taking: Reported on 10/05/2021 03/03/21   Mirna Mires, CNM  norethindrone (MICRONOR) 0.35 MG tablet Take 1 tablet (0.35 mg total) by mouth daily. Patient not taking: Reported on 10/05/2021 03/31/21   Mirna Mires, CNM    No Known Allergies  Gynecologic History: No LMP recorded. History of abnormal pap smear: No History of STI: Yes   Obstetric History: G2P2002  Social History   Socioeconomic History   Marital status: Single    Spouse name: Not on file   Number of children: 1   Years of education: Not on file   Highest education level: Not on file  Occupational History   Occupation: RETAIL Investment banker, corporate: [id number]  Tobacco Use   Smoking status: Former    Packs/day: 1.00    Years:  10.00    Pack years: 10.00    Types: Cigarettes   Smokeless tobacco: Never   Tobacco comments:    Quit 2019; Started at age 45  Vaping Use   Vaping Use: Every day   Last attempt to quit: 07/07/2018   Substances: CBD  Substance and Sexual Activity   Alcohol use: No    Comment: 3-4 mixed drinks per week prior to pregnancy   Drug use: No   Sexual activity: Yes    Birth control/protection: None    Comment: possible pill   Other Topics Concern   Not on file  Social History Narrative   Has 3yo daughter; lives with her mother and stepfather and her daughter.   Social Determinants of Health   Financial Resource Strain: Not on file  Food  Insecurity: Not on file  Transportation Needs: Not on file  Physical Activity: Not on file  Stress: Not on file  Social Connections: Not on file  Intimate Partner Violence: Not on file    Family History  Problem Relation Age of Onset   Hypertension Father    Stroke Father    Cancer Maternal Grandfather 31       kidney   Hypertension Maternal Grandfather    Ovarian cancer Paternal Grandmother    Hypertension Mother    Diabetes Paternal Grandfather    Cancer Other 37       BREAST   Endometriosis Other    Ovarian cancer Other    Cervical cancer Other     Review of Systems  Constitutional:  Positive for malaise/fatigue.  HENT:  Positive for tinnitus.   Eyes: Negative.   Respiratory: Negative.    Cardiovascular: Negative.   Gastrointestinal: Negative.   Musculoskeletal: Negative.   Skin: Negative.   Neurological: Negative.   Endo/Heme/Allergies: Negative.   Psychiatric/Behavioral:  Positive for depression. The patient is nervous/anxious.     Physical Exam BP 122/70   Ht 5\' 3"  (1.6 m)   Wt 166 lb 9.6 oz (75.6 kg)   Breastfeeding Yes   BMI 29.51 kg/m    Physical Exam Constitutional:      Appearance: Normal appearance. She is normal weight.  Genitourinary:     Vulva and rectum normal.     Genitourinary Comments: No external lesions ore irritation. No vaginal discharge of note. Normal vaginal rugae. Uterus is non enlarged, anteverted and mobile. No adnexal tenderness.  HENT:     Head: Normocephalic and atraumatic.  Cardiovascular:     Rate and Rhythm: Normal rate and regular rhythm.     Pulses: Normal pulses.     Heart sounds: Normal heart sounds.  Pulmonary:     Effort: Pulmonary effort is normal.     Breath sounds: Normal breath sounds.  Abdominal:     General: Abdomen is flat.     Palpations: Abdomen is soft.  Musculoskeletal:        General: Normal range of motion.     Cervical back: Normal range of motion and neck supple.  Neurological:     General: No  focal deficit present.     Mental Status: She is alert and oriented to person, place, and time.  Skin:    General: Skin is warm and dry.  Psychiatric:        Behavior: Behavior normal.        Thought Content: Thought content normal.     Comments: See PHQ and GAD- elevated scores.    Female chaperone present for pelvic and  breast  portions of the physical exam  Results:  PHQ-9: 16   Assessment: 25 y.o. G23P2002 female here for routine annual gynecologic examination Mixed depressive/ anxiety symptoms  Plan: Problem List Items Addressed This Visit   None Visit Diagnoses     Women's annual routine gynecological examination    -  Primary       Screening: -- Blood pressure screen normal -- Weight screening: normal -- Depression screening negative (PHQ-9)16  GAD score is 12 -- Nutrition: normal -- cholesterol screening: not due for screening -- osteoporosis screening: not due -- tobacco screening:  did not discuss today- vape use -- alcohol screening: AUDIT questionnaire indicates low-risk usage. -- family history of breast cancer screening: done. not at high risk. -- no evidence of domestic violence or intimate partner violence. -- STD screening: gonorrhea/chlamydia NAAT collected -- pap smear collected per ASCCP guidelines -- flu vaccine  declines today -- HPV vaccination series: has not received - pt refuses Additional time spent again addressing her ongoing depressive/anxiety symptoms. She was offered and prescribed Lexapro before and did not start on the medication. Suggested another trial of escitalopram and reaching out for a counselor. Discussed the importance of daily sunshine ( light lamp and option), exercise and adequate sleep. She will start on the Lexapro and f/u in one month.  Mirna Mires, CNM  10/05/2021 12:35 PM    10/05/2021 8:42 AM

## 2021-10-06 LAB — CYTOLOGY - PAP: Diagnosis: NEGATIVE

## 2021-10-08 LAB — NUSWAB VG+, CANDIDA 6SP
C PARAPSILOSIS/TROPICALIS: NEGATIVE
Candida albicans, NAA: NEGATIVE
Candida glabrata, NAA: NEGATIVE
Candida krusei, NAA: NEGATIVE
Candida lusitaniae, NAA: NEGATIVE
Chlamydia trachomatis, NAA: NEGATIVE
Neisseria gonorrhoeae, NAA: NEGATIVE
Trich vag by NAA: NEGATIVE

## 2021-10-11 ENCOUNTER — Encounter: Payer: Self-pay | Admitting: Obstetrics

## 2021-11-09 ENCOUNTER — Ambulatory Visit: Payer: BC Managed Care – PPO | Admitting: Obstetrics

## 2022-04-17 ENCOUNTER — Ambulatory Visit (INDEPENDENT_AMBULATORY_CARE_PROVIDER_SITE_OTHER): Payer: BC Managed Care – PPO | Admitting: Obstetrics

## 2022-04-17 ENCOUNTER — Encounter: Payer: Self-pay | Admitting: Obstetrics

## 2022-04-17 ENCOUNTER — Other Ambulatory Visit (HOSPITAL_COMMUNITY)
Admission: RE | Admit: 2022-04-17 | Discharge: 2022-04-17 | Disposition: A | Discharge status: Home / Self Care | Payer: BC Managed Care – PPO | Source: Ambulatory Visit | Attending: Obstetrics | Admitting: Obstetrics

## 2022-04-17 VITALS — BP 122/70 | Ht 63.0 in | Wt 154.0 lb

## 2022-04-17 DIAGNOSIS — B9689 Other specified bacterial agents as the cause of diseases classified elsewhere: Secondary | ICD-10-CM | POA: Insufficient documentation

## 2022-04-17 DIAGNOSIS — Z113 Encounter for screening for infections with a predominantly sexual mode of transmission: Secondary | ICD-10-CM | POA: Diagnosis present

## 2022-04-17 DIAGNOSIS — Z30011 Encounter for initial prescription of contraceptive pills: Secondary | ICD-10-CM

## 2022-04-17 DIAGNOSIS — N760 Acute vaginitis: Secondary | ICD-10-CM | POA: Diagnosis not present

## 2022-04-17 MED ORDER — NORETHIN ACE-ETH ESTRAD-FE 1-20 MG-MCG PO TABS: 1.0000 | ORAL_TABLET | Freq: Every day | ORAL | 3 refills | Status: DC

## 2022-04-17 NOTE — Progress Notes (Signed)
Mandy Reeves present today to request OCPs for contraception. She is a patient well known to the office. Last seen in November of 2022 for PP care and discussion re some mild depression. She was offered and accepted a trial of SSRI medication. Today, she shares that she did not take the medication.She is not struggling with either anxiety or depression; she changed jobs, moved into her own place with her daughters, and is in a new relationship. She would like to start on OCPs and also requests STI screening. BP 122/70   Ht 5\' 3"  (1.6 m)   Wt 154 lb (69.9 kg)   LMP 04/13/2022   BMI 27.28 kg/m  Review of Systems  Constitutional: Negative.   Eyes: Negative.   Respiratory: Negative.    Cardiovascular: Negative.   Gastrointestinal: Negative.   Genitourinary: Negative.   Musculoskeletal: Negative.   Skin: Negative.   Neurological: Negative.   Endo/Heme/Allergies: Negative.   Physical Exam Vitals and nursing note reviewed.  Constitutional:      Appearance: Normal appearance. She is normal weight.  HENT:     Head: Normocephalic and atraumatic.  Cardiovascular:     Rate and Rhythm: Normal rate and regular rhythm.     Pulses: Normal pulses.     Heart sounds: Normal heart sounds.  Pulmonary:     Effort: Pulmonary effort is normal.     Breath sounds: Normal breath sounds.  Abdominal:     Palpations: Abdomen is soft.  Genitourinary:    General: Normal vulva.     Rectum: Normal.     Comments: No exteranl lesions or discharge noted. Normal vaginal mucosa and rugae.  Musculoskeletal:     Cervical back: Normal range of motion and neck supple.  Skin:    General: Skin is warm and dry.  Neurological:     General: No focal deficit present.     Mental Status: She is alert and oriented to person, place, and time.  Psychiatric:        Mood and Affect: Mood normal.        Behavior: Behavior normal.    Reviewed all forms of birth control options available including abstinence; over the  counter/barrier methods; hormonal contraceptive medication including pill, patch, ring, injection,contraceptive implant; hormonal and nonhormonal IUDs; permanent sterilization options including vasectomy and the various tubal sterilization modalities. Risks and benefits reviewed.  Questions were answered.  Information was given to patient to review.   She opts to start on a low dosage OCP.   A: Initiation of oral contraceptive pills  P: Rx for Junel provided and sent to pharmacy. She is due for an annual Gyn physical and will make an appointment. Careful instructions regarding how to take her pills and what to do if she misses any.  Time spent providing direct care and reviewing her records, ordering labs is 30 min.  06/13/2022, CNM  04/17/2022 3:11 PM

## 2022-04-19 LAB — CERVICOVAGINAL ANCILLARY ONLY
Bacterial Vaginitis (gardnerella): POSITIVE — AB
Chlamydia: NEGATIVE
Comment: NEGATIVE
Comment: NEGATIVE
Comment: NEGATIVE
Comment: NORMAL
Neisseria Gonorrhea: NEGATIVE
Trichomonas: NEGATIVE

## 2022-04-20 ENCOUNTER — Other Ambulatory Visit: Payer: Self-pay

## 2022-04-20 ENCOUNTER — Encounter: Payer: Self-pay | Admitting: Obstetrics

## 2022-04-20 DIAGNOSIS — B9689 Other specified bacterial agents as the cause of diseases classified elsewhere: Secondary | ICD-10-CM

## 2022-04-20 MED ORDER — METRONIDAZOLE 500 MG PO TABS: 500.0000 mg | ORAL_TABLET | Freq: Two times a day (BID) | ORAL | 0 refills | Status: AC

## 2022-04-22 ENCOUNTER — Encounter: Payer: Self-pay | Admitting: Obstetrics

## 2022-04-22 ENCOUNTER — Other Ambulatory Visit: Payer: Self-pay | Admitting: Obstetrics

## 2022-04-22 NOTE — Progress Notes (Signed)
Aptima swab reveals + BV. Rx for metronidazole sent to pharmacy, and the patient is notified via My Chart.  Mirna Mires, CNM  04/22/2022 4:20 AM

## 2022-05-23 ENCOUNTER — Encounter: Payer: Self-pay | Admitting: Obstetrics

## 2023-03-09 ENCOUNTER — Other Ambulatory Visit (HOSPITAL_COMMUNITY)
Admission: RE | Admit: 2023-03-09 | Discharge: 2023-03-09 | Disposition: A | Discharge status: Home / Self Care | Payer: Medicaid Other | Source: Ambulatory Visit | Attending: Obstetrics | Admitting: Obstetrics

## 2023-03-09 ENCOUNTER — Ambulatory Visit (INDEPENDENT_AMBULATORY_CARE_PROVIDER_SITE_OTHER): Payer: Medicaid Other

## 2023-03-09 VITALS — BP 91/63 | HR 108 | Ht 63.0 in | Wt 153.0 lb

## 2023-03-09 DIAGNOSIS — N898 Other specified noninflammatory disorders of vagina: Secondary | ICD-10-CM | POA: Diagnosis present

## 2023-03-09 DIAGNOSIS — Z113 Encounter for screening for infections with a predominantly sexual mode of transmission: Secondary | ICD-10-CM | POA: Diagnosis present

## 2023-03-09 NOTE — Progress Notes (Signed)
    NURSE VISIT NOTE  Subjective:    Patient ID: Mandy Reeves, female    DOB: [birth year], 27 y.o.   MRN: [id number]  HPI  Patient is a 27 y.o. G62P2002 female who presents for vaginal discharge and fishy/sour odor. Denies vaginal itching or irritation. Denies abnormal vaginal bleeding or significant pelvic pain or fever.   Objective:    BP 91/63   Pulse (!) 108   Ht 5\' 3"  (1.6 m)   Wt 153 lb (69.4 kg)   LMP 02/19/2023 (Approximate)   Breastfeeding No   BMI 27.10 kg/m     Assessment:   1. Screening for STD (sexually transmitted disease)   2. Vaginal discharge   3. Vaginal odor      Plan:   GC and chlamydia probe sent to lab. Treatment: Will wait for results to be treated if needed. ROV prn if symptoms persist or worsen. Patient was advised to schedule annual exam at check out. Last annual 10/05/2021.   Donnetta Hail, CMA

## 2023-03-09 NOTE — Patient Instructions (Signed)
Bacterial Vaginosis  Bacterial vaginosis is an infection of the vagina. It happens when too many normal germs (healthy bacteria) grow in the vagina. This infection can make it easier to get other infections from sex (STIs). It is very important for pregnant women to get treated. This infection can cause babies to be born early or at a low birth weight. What are the causes? This infection is caused by an increase in certain germs that grow in the vagina. You cannot get this infection from toilet seats, bedsheets, swimming pools, or things that touch your vagina. What increases the risk? Having sex with a new person or more than one person. Having sex without protection. Douching. Having an intrauterine device (IUD). Smoking. Using drugs or drinking alcohol. These can lead you to do things that are risky. Taking certain antibiotic medicines. Being pregnant. What are the signs or symptoms? Some women have no symptoms. Symptoms may include: A discharge from your vagina. It may be gray or white. It can be watery or foamy. A fishy smell. This can happen after sex or during your menstrual period. Itching in and around your vagina. A feeling of burning or pain when you pee (urinate). How is this treated? This infection is treated with antibiotic medicines. These may be given to you as: A pill. A cream for your vagina. A medicine that you put into your vagina (suppository). If the infection comes back after treatment, you may need more antibiotics. Follow these instructions at home: Medicines Take over-the-counter and prescription medicines as told by your doctor. Take or use your antibiotic medicine as told by your doctor. Do not stop taking or using it, even if you start to feel better. General instructions If the person you have sex with is a woman, tell her that you have this infection. She will need to follow up with her doctor. If you have a female partner, he does not need to be  treated. Do not have sex until you finish treatment. Drink enough fluid to keep your pee pale yellow. Keep your vagina and butt clean. Wash the area with warm water each day. Wipe from front to back after you use the toilet. If you are breastfeeding a baby, ask your doctor if you should keep doing so during treatment. Keep all follow-up visits. How is this prevented? Self-care Do not douche. Use only warm water to wash around your vagina. Wear underwear that is cotton or lined with cotton. Do not wear tight pants and pantyhose, especially in the summer. Safe sex Use protection when you have sex. This includes: Use condoms. Use dental dams. This is a thin layer that protects the mouth during oral sex. Limit how many people you have sex with. To prevent this infection, it is best to have sex with just one person. Get tested for STIs. The person you have sex with should also get tested. Drugs and alcohol Do not smoke or use any products that contain nicotine or tobacco. If you need help quitting, ask your doctor. Do not use drugs. Do not drink alcohol if: Your doctor tells you not to drink. You are pregnant, may be pregnant, or are planning to become pregnant. If you drink alcohol: Limit how much you have to 0-1 drink a day. Know how much alcohol is in your drink. In the [place] one drink equals one 12 oz bottle of beer (355 mL), one 5 oz glass of wine (148 mL), or one 1 oz glass of hard liquor (44   mL). Where to find more information Centers for Disease Control and Prevention: FootballExhibition.com.br American Sexual Health Association: www.ashastd.org Office on [employer] Health: http://hoffman.com/ Contact a doctor if: Your symptoms do not get better, even after you are treated. You have more discharge or pain when you pee. You have a fever or chills. You have pain in your belly (abdomen) or in the area between your hips. You have pain with sex. You bleed from your vagina between menstrual  periods. Summary This infection can happen when too many germs (bacteria) grow in the vagina. This infection can make it easier to get infections from sex (STIs). Treating this can lower that chance. Get treated if you are pregnant. This infection can cause babies to be born early. Do not stop taking or using your antibiotic medicine, even if you start to feel better. This information is not intended to replace advice given to you by your health care provider. Make sure you discuss any questions you have with your health care provider. Document Revised: 04/22/2020 Document Reviewed: 04/22/2020 Elsevier Patient Education  2023 Elsevier Inc. Gonorrhea Gonorrhea is a sexually transmitted infection (STI) that can infect any person. If left untreated, this infection can: Damage the reproductive organs. Spread to other parts of the body. Cause someone to be unable to have children (infertility). Harm an unborn baby if an infected person is pregnant. It is important to get treatment for gonorrhea as soon as possible. All of your sex partners may also need to be treated for the infection. What are the causes? This condition is caused by bacteria called Neisseria gonorrhoeae. The infection is spread from person to person through sexual contact, including oral, anal, and vaginal sex. The infection can also pass from a pregnant person to the baby during birth. What increases the risk? The following factors may make you more likely to develop this condition: Being a woman younger than 25 years and sexually active. Being a man who has sex with men. Having a new sex partner or having multiple partners. Having a sex partner who has an STI. Not using condoms correctly or not using condoms every time you have sex. Having a history of STIs. What are the signs or symptoms? When symptoms occur, they may include: Abnormal discharge from the penis or vagina. The discharge may be cloudy, thick, or yellow-green  in color. Pain or burning when you urinate. Itching, irritation, pain, bleeding, or discharge from the rectum. This may occur if the infection was spread by anal sex. Sore throat or swollen lymph nodes in the neck. This may occur if the infection was spread by oral sex. Pain or swelling in the testicles. Bleeding between menstrual periods. If the infection has spread to other areas of the body, symptoms may include: Fever. Eye irritation. Swelling, redness, warmth, and pain in the joints. Rashes. In some cases, there are no symptoms. How is this diagnosed? This condition is diagnosed based on: A physical exam. A swab of fluid. The swab of fluid may be taken from the penis, vagina, throat, or rectum. Urine tests. Not all test results will be available during your visit. How is this treated? This condition is treated with antibiotic medicines. It is important to start treatment as soon as possible. Early treatment may prevent some problems from developing. You should not have sex during treatment. All types of sexual activity should be avoided for at least 7 days after treatment is complete and until your sex partner or partners have been treated. Follow  these instructions at home: Take over-the-counter and prescription medicines as told by your health care provider. Finish all antibiotic medicine even when you start to feel better. Do not have sex during treatment. Do not have sex until at least 7 days after you and your partner or partners have finished treatment and your health care provider says it is okay. It is up to you to get your test results. Ask your health care provider, or the department that is doing the test, when your results will be ready. If you get a positive result on your gonorrhea test, tell your recent sex partners. These include any partners for oral, anal, or vaginal sex. They need to be checked for gonorrhea even if they do not have symptoms. They may need treatment,  even if they get negative results on their gonorrhea tests. Keep all follow-up visits. This is important. How is this prevented?  Use latex or polyurethane condoms correctly every time you have sex. Ask if your sex partner or partners have been tested for STIs and had negative results. Avoid having multiple sex partners. Get regular health screenings to check for STIs. Contact a health care provider if: Your symptoms do not get better after a few days of taking antibiotics. Your symptoms get worse. You cannot take your medicine as directed by your health care provider. You develop new symptoms, including: Eye irritation. Swelling, redness, warmth, and pain in the joints. Rashes. You have a fever. Summary Gonorrhea is a sexually transmitted infection (STI) that can infect any person. This infection is spread from person to person through sexual contact, including oral, anal, and vaginal sex. The infection can also pass from a pregnant person to the baby during birth. Symptoms include abnormal discharge, pain or burning while urinating, or pain in the rectum. This condition is treated with antibiotic medicines. Do not have sex until at least 7 days after both you and any sex partners have completed antibiotic treatment. Tell your health care provider if you have trouble taking your medicine, your symptoms get worse, or you have new symptoms. Keep all follow-up visits. This information is not intended to replace advice given to you by your health care provider. Make sure you discuss any questions you have with your health care provider. Document Revised: 09/15/2021 Document Reviewed: 09/15/2021 Elsevier Patient Education  2023 [employer].

## 2023-03-13 ENCOUNTER — Other Ambulatory Visit: Payer: Self-pay

## 2023-03-13 DIAGNOSIS — B9689 Other specified bacterial agents as the cause of diseases classified elsewhere: Secondary | ICD-10-CM

## 2023-03-13 LAB — CERVICOVAGINAL ANCILLARY ONLY
Bacterial Vaginitis (gardnerella): POSITIVE — AB
Candida Glabrata: NEGATIVE
Candida Vaginitis: NEGATIVE
Chlamydia: NEGATIVE
Comment: NEGATIVE
Comment: NEGATIVE
Comment: NEGATIVE
Comment: NEGATIVE
Comment: NEGATIVE
Comment: NORMAL
Neisseria Gonorrhea: NEGATIVE
Trichomonas: NEGATIVE

## 2023-03-13 MED ORDER — METRONIDAZOLE 500 MG PO TABS: 500.0000 mg | ORAL_TABLET | Freq: Two times a day (BID) | ORAL | 0 refills | Status: DC

## 2023-04-04 ENCOUNTER — Other Ambulatory Visit (HOSPITAL_COMMUNITY)
Admission: RE | Admit: 2023-04-04 | Discharge: 2023-04-04 | Disposition: A | Discharge status: Home / Self Care | Payer: Medicaid Other | Source: Ambulatory Visit | Attending: Obstetrics | Admitting: Obstetrics

## 2023-04-04 ENCOUNTER — Encounter: Payer: Self-pay | Admitting: Obstetrics

## 2023-04-04 ENCOUNTER — Ambulatory Visit (INDEPENDENT_AMBULATORY_CARE_PROVIDER_SITE_OTHER): Payer: Medicaid Other | Admitting: Obstetrics

## 2023-04-04 VITALS — BP 114/74 | HR 88 | Wt 140.6 lb

## 2023-04-04 DIAGNOSIS — N898 Other specified noninflammatory disorders of vagina: Secondary | ICD-10-CM

## 2023-04-04 DIAGNOSIS — Z01419 Encounter for gynecological examination (general) (routine) without abnormal findings: Secondary | ICD-10-CM | POA: Insufficient documentation

## 2023-04-04 DIAGNOSIS — Z124 Encounter for screening for malignant neoplasm of cervix: Secondary | ICD-10-CM

## 2023-04-04 NOTE — Progress Notes (Signed)
Gynecology Annual Exam  PCP: [name], FNP  Chief Complaint:  Chief Complaint  Patient presents with   Gynecologic Exam    History of Present Illness:  [name] is a 26 y.o. [id number] who LMP was Patient's last menstrual period was 03/23/2023 (exact date)., presents today for her annual examination.  Her menses are regular every 28-30 days, lasting 5 day(s).  Dysmenorrhea mild, occurring first 1-2 days of flow. She does not have intermenstrual bleeding. Mandy Reeves has stopped all medications, including her OCPs, tobacco and any MJ use. She is no longer having any depressive concerns; still recognizes some mild anxiety. She is exercising a lot and has changed her diet, eliminating all soda and sugary foods. She practices some intermittent fasting.  She is not sexually active. She is in a new relationship, but they have not been intimate sexually. Last Pap: October 05, 2021  Results were: no abnormalities /neg  Hx of STDs: none  There is no FH of breast cancer. There is no FH of ovarian cancer. The patient does do self-breast exams.  Tobacco use: The patient denies current or previous tobacco use. Alcohol use: none Exercise: very active    The patient wears seatbelts: yes.   The patient reports that domestic violence in her life is absent.   Past Medical History:  Diagnosis Date   Anxiety    Closed nondisplaced fracture of medial malleolus of left tibia 04/20/2017   Depression    Menorrhagia    Orthostatic hypotension    Postpartum care following vaginal delivery 02/04/2016   Sprain of left ankle 04/23/2017   Supervision of other normal pregnancy, antepartum 07/14/2020   [facility] Prenatal Labs Dating 9 wk u/s Blood type: O/Positive/-- (09/08 1204)  Genetic Screen declines Antibody:Negative (09/08 1204) Anatomic [place] Complete with R pelviectasis - f/u at 32 weeks normal Rubella: 1.28 (09/08 1204) Varicella: immune GTT  Third trimester: 98 RPR: Non Reactive (09/08  1204)  Rhogam  n/a HBsAg: Negative (09/08 1204)  TDaP vaccine  Declined                    Flu Sho    Past Surgical History:  Procedure Laterality Date   TONSILLECTOMY AND ADENOIDECTOMY  05/2013   at age 2   WISDOM TOOTH EXTRACTION  02/2020    Prior to Admission medications   Medication Sig Start Date End Date Taking? Authorizing Provider  metroNIDAZOLE (FLAGYL) 500 MG tablet Take 1 tablet (500 mg total) by mouth 2 (two) times daily. 03/13/23   Hildred Laser, MD    No Known Allergies  Gynecologic History: Patient's last menstrual period was 03/23/2023 (exact date). History of abnormal pap smear: No History of STI: No   Obstetric History: G2P2002  Social History   Socioeconomic History   Marital status: Single    Spouse name: Not on file   Number of children: 1   Years of education: Not on file   Highest education level: Not on file  Occupational History   Occupation: RETAIL Investment banker, corporate: [id number]  Tobacco Use   Smoking status: Former    Packs/day: 1.00    Years: 10.00    Additional pack years: 0.00    Total pack years: 10.00    Types: Cigarettes   Smokeless tobacco: Never   Tobacco comments:    Quit 2019; Started at age 85  Vaping Use   Vaping Use: Every day   Last attempt to quit: 07/07/2018  Substances: CBD  Substance and Sexual Activity   Alcohol use: No    Comment: 3-4 mixed drinks per week prior to pregnancy   Drug use: No   Sexual activity: Not Currently    Birth control/protection: None  Other Topics Concern   Not on file  Social History Narrative   Has 3yo daughter; lives with her mother and stepfather and her daughter.   Social Determinants of Health   Financial Resource Strain: Low Risk  (01/07/2019)   Overall Financial Resource Strain (CARDIA)    Difficulty of Paying Living Expenses: Not hard at all  Food Insecurity: No Food Insecurity (01/07/2019)   Hunger Vital Sign    Worried About Running Out of Food in the Last Year: Never true    Ran  Out of Food in the Last Year: Never true  Transportation Needs: No Transportation Needs (01/07/2019)   PRAPARE - Administrator, Civil Service (Medical): No    Lack of Transportation (Non-Medical): No  Physical Activity: Inactive (01/07/2019)   Exercise Vital Sign    Days of Exercise per Week: 0 days    Minutes of Exercise per Session: 0 min  Stress: No Stress Concern Present (01/07/2019)   [employer] of Occupational Health - Occupational Stress Questionnaire    Feeling of Stress : Not at all  Social Connections: Moderately Isolated (01/07/2019)   Social Connection and Isolation Panel [NHANES]    Frequency of Communication with Friends and Family: More than three times a week    Frequency of Social Gatherings with Friends and Family: More than three times a week    Attends Religious Services: Never    Database administrator or Organizations: No    Attends Banker Meetings: Never    Marital Status: Never married  Intimate Partner Violence: Not At Risk (01/07/2019)   Humiliation, Afraid, Rape, and Kick questionnaire    Fear of Current or Ex-Partner: No    Emotionally Abused: No    Physically Abused: No    Sexually Abused: No    Family History  Problem Relation Age of Onset   Hypertension Father    Stroke Father    Cancer Maternal Grandfather 23       kidney   Hypertension Maternal Grandfather    Ovarian cancer Paternal Grandmother    Hypertension Mother    Diabetes Paternal Grandfather    Cancer Other 54       BREAST   Endometriosis Other    Ovarian cancer Other    Cervical cancer Other     Review of Systems  Constitutional: Negative.   HENT: Negative.    Eyes: Negative.   Respiratory: Negative.    Cardiovascular: Negative.   Gastrointestinal: Negative.   Genitourinary: Negative.   Musculoskeletal: Negative.   Skin: Negative.   Neurological: Negative.   Endo/Heme/Allergies: Negative.   Psychiatric/Behavioral: Negative.         PHQ score is  3     Physical Exam BP 114/74   Pulse 88   Wt 140 lb 9.6 oz (63.8 kg)   LMP 03/23/2023 (Exact Date)   BMI 24.91 kg/m    Physical Exam Constitutional:      Appearance: Normal appearance.  Genitourinary:     Vulva and rectum normal.     Genitourinary Comments: Discharge is non malodorous, white and smooth.     Vaginal discharge present.  HENT:     Head: Normocephalic and atraumatic.  Cardiovascular:     Rate  and Rhythm: Normal rate and regular rhythm.     Pulses: Normal pulses.     Heart sounds: Normal heart sounds.  Pulmonary:     Effort: Pulmonary effort is normal.     Breath sounds: Normal breath sounds.  Abdominal:     General: Abdomen is flat.     Palpations: Abdomen is soft.  Musculoskeletal:        General: Normal range of motion.     Cervical back: Normal range of motion and neck supple.  Neurological:     General: No focal deficit present.     Mental Status: She is alert and oriented to person, place, and time.  Skin:    General: Skin is warm and dry.  Psychiatric:        Mood and Affect: Mood normal.        Behavior: Behavior normal.     Female chaperone present for pelvic and breast  portions of the physical exam  Results: AUDIT Questionnaire (screen for alcoholism): not drinking PHQ-9: 3   Assessment: 27 y.o. G80P2002 female here for routine annual gynecologic examination Declines formal , hormonal birth control.  Plan: Problem List Items Addressed This Visit   None Visit Diagnoses     Cervical cancer screening    -  Primary   Relevant Orders   Cytology - PAP       Screening: -- Blood pressure screen normal -- Weight screening: normal -- Depression screening negative (PHQ-9) -- Nutrition: normal -- cholesterol screening: not due for screening -- osteoporosis screening: not due -- tobacco screening: not using -- alcohol screening: AUDIT questionnaire indicates low-risk usage. -- family history of breast cancer screening: done. not at  high risk. -- no evidence of domestic violence or intimate partner violence. -- STD screening: gonorrhea/chlamydia NAAT  aptima swab retrieved to test for BV/yeast. -- pap smear collected per ASCCP guidelines -- flu vaccine  per her PCP   Mirna Mires, CNM  04/04/2023 9:43 AM   04/04/2023 9:43 AM

## 2023-04-06 LAB — CERVICOVAGINAL ANCILLARY ONLY
Bacterial Vaginitis (gardnerella): NEGATIVE
Candida Glabrata: NEGATIVE
Candida Vaginitis: NEGATIVE
Comment: NEGATIVE
Comment: NEGATIVE
Comment: NEGATIVE

## 2023-04-08 ENCOUNTER — Encounter: Payer: Self-pay | Admitting: Obstetrics

## 2023-04-10 LAB — CYTOLOGY - PAP
Diagnosis: NEGATIVE
Diagnosis: REACTIVE

## 2024-03-24 ENCOUNTER — Other Ambulatory Visit: Payer: Self-pay

## 2024-03-24 DIAGNOSIS — W260XXA Contact with knife, initial encounter: Secondary | ICD-10-CM | POA: Diagnosis not present

## 2024-03-24 DIAGNOSIS — S6992XA Unspecified injury of left wrist, hand and finger(s), initial encounter: Secondary | ICD-10-CM | POA: Diagnosis present

## 2024-03-24 DIAGNOSIS — S61412A Laceration without foreign body of left hand, initial encounter: Secondary | ICD-10-CM | POA: Insufficient documentation

## 2024-03-24 DIAGNOSIS — Z23 Encounter for immunization: Secondary | ICD-10-CM | POA: Diagnosis not present

## 2024-03-24 NOTE — ED Triage Notes (Signed)
 Pt presents via POV c/o laceration to left hand x1hr PTA.

## 2024-03-25 ENCOUNTER — Emergency Department

## 2024-03-25 ENCOUNTER — Emergency Department
Admission: EM | Admit: 2024-03-25 | Discharge: 2024-03-25 | Disposition: A | Discharge status: Home / Self Care | Attending: Emergency Medicine | Admitting: Emergency Medicine

## 2024-03-25 DIAGNOSIS — S61412A Laceration without foreign body of left hand, initial encounter: Secondary | ICD-10-CM | POA: Diagnosis not present

## 2024-03-25 MED ORDER — TETANUS-DIPHTH-ACELL PERTUSSIS 5-2.5-18.5 LF-MCG/0.5 IM SUSY
0.5000 mL | PREFILLED_SYRINGE | Freq: Once | INTRAMUSCULAR | Status: AC
  Administered 2024-03-25: 0.5 mL via INTRAMUSCULAR
  Filled 2024-03-25: qty 0.5

## 2024-03-25 MED ORDER — LIDOCAINE HCL (PF) 1 % IJ SOLN
10.0000 mL | Freq: Once | INTRAMUSCULAR | Status: AC
  Administered 2024-03-25: 10 mL
  Filled 2024-03-25: qty 10

## 2024-03-25 MED ORDER — BACITRACIN ZINC 500 UNIT/GM EX OINT
TOPICAL_OINTMENT | Freq: Two times a day (BID) | CUTANEOUS | Status: AC
  Filled 2024-03-25: qty 0.9

## 2024-03-25 NOTE — ED Provider Notes (Signed)
 [facility] Provider Note    Event Date/Time   First MD Initiated Contact with Patient 03/25/24 [phone]     (approximate)   History   Laceration   HPI  Mandy Reeves is a 28 y.o. female   She comes to the emergency department with an accidental wound to the webspace between the 1st and 2nd digits on her left hand as she was trying to open something with a knife and punctured her hand.  No other injuries.  Unknown last tetanus.   Independent Historian contributed to assessment above: Her friend Autry Legions is at bedside to corroborate information past medical history as above    Physical Exam   Triage Vital Signs: ED Triage Vitals [03/24/24 2251]  Encounter Vitals Group     BP (!) 125/91     Systolic BP Percentile      Diastolic BP Percentile      Pulse Rate 94     Resp 16     Temp 99.3 F (37.4 C)     Temp Source Oral     SpO2 97 %     Weight      Height      Head Circumference      Peak Flow      Pain Score 0     Pain Loc      Pain Education      Exclude from Growth Chart     Most recent vital signs: Vitals:   03/24/24 2251  BP: (!) 125/91  Pulse: 94  Resp: 16  Temp: 99.3 F (37.4 C)  SpO2: 97%    General: Awake, no distress.  CV:  Good peripheral perfusion.  Resp:  Normal effort.  Abd:  No distention.  Other:  Small punctate 0.5 cm gaping laceration to the webspace of the left hand between the 1st and 2nd digits.  Hemostatic.  Sensation intact, full range of motion   ED Results / Procedures / Treatments   Labs (all labs ordered are listed, but only abnormal results are displayed) Labs Reviewed - No data to display     RADIOLOGY I independently reviewed and interpreted x-ray of the hand see no obvious foreign bodies or fracture I also reviewed radiologist's formal read.   PROCEDURES:  Critical Care performed: No  .Laceration Repair  Date/Time: 03/25/2024 4:07 AM  Performed by: Buell Carmin, MD Authorized by:  Buell Carmin, MD   Consent:    Consent obtained:  Verbal   Consent given by:  Patient   Risks discussed:  Need for additional repair, infection and poor wound healing   Alternatives discussed:  No treatment Universal protocol:    Procedure explained and questions answered to patient or proxy's satisfaction: yes     Patient identity confirmed:  Verbally with patient Anesthesia:    Anesthesia method:  Local infiltration   Local anesthetic:  Lidocaine  1% w/o epi Laceration details:    Location:  Hand   Hand location:  L palm   Length (cm):  0.5   Depth (mm):  3 Exploration:    Imaging obtained: x-ray     Imaging outcome: foreign body not noted     Wound exploration: wound explored through full range of motion     Wound extent: no foreign body   Treatment:    Area cleansed with:  Soap and water   Amount of cleaning:  Standard   Irrigation solution:  Tap water   Irrigation method:  Tap Skin  repair:    Repair method:  Sutures   Suture size:  5-0   Suture material:  Nylon   Suture technique:  Simple interrupted   Number of sutures:  2 Approximation:    Approximation:  Close Repair type:    Repair type:  Simple Post-procedure details:    Procedure completion:  Tolerated    MEDICATIONS ORDERED IN ED: Medications  bacitracin ointment (has no administration in time range)  lidocaine  (PF) (XYLOCAINE ) 1 % injection 10 mL (10 mLs Other Given 03/25/24 0350)  Tdap (BOOSTRIX) injection 0.5 mL (0.5 mLs Intramuscular Given 03/25/24 0351)     IMPRESSION / MDM / ASSESSMENT AND PLAN / ED COURSE  I reviewed the triage vital signs and the nursing notes.                                Patient's presentation is most consistent with acute complicated illness / injury requiring diagnostic workup.  Differential diagnosis includes, but is not limited to, laceration, foreign body, fracture, tendon or neurovascular injury   MDM:   Wound repaired and tolerated as above.  X-ray with no  findings of foreign body.  No evidence of tendon or neurovascular injury.  Anticipatory guidance given discharge.      FINAL CLINICAL IMPRESSION(S) / ED DIAGNOSES   Final diagnoses:  Laceration of left hand without foreign body, initial encounter     Rx / DC Orders   ED Discharge Orders     None        Note:  This document was prepared using Dragon voice recognition software and may include unintentional dictation errors.    Buell Carmin, MD 03/25/24 [phone]

## 2024-03-25 NOTE — Discharge Instructions (Addendum)
 See your doctor return to the emergency department in 7 to 10 days for suture removal.  Please keep your wound clean by washing at least daily with soap and water.  Apply antibiotic ointment and a bandage.  If you see any signs of infection like spreading redness, pus coming from the wound, extreme pain, fevers, chills or any other worsening doctor right away or come back to the emergency department  Thank you for choosing us  for your health care today!  Please see your primary doctor this week for a follow up appointment.   If you have any new, worsening, or unexpected symptoms call your doctor right away or come back to the emergency department for reevaluation.  It was my pleasure to care for you today.   Arron Large Margery Sheets, MD
